# Patient Record
Sex: Male | Born: 1960 | Race: White | Hispanic: No | Marital: Married | Smoking: Never smoker
Health system: Southern US, Community
[De-identification: ages and names within clinical notes are randomized; demographics above are authoritative.]

## PROBLEM LIST (undated history)

## (undated) DIAGNOSIS — F419 Anxiety disorder, unspecified: Secondary | ICD-10-CM

## (undated) DIAGNOSIS — N529 Male erectile dysfunction, unspecified: Secondary | ICD-10-CM

## (undated) DIAGNOSIS — J343 Hypertrophy of nasal turbinates: Secondary | ICD-10-CM

## (undated) DIAGNOSIS — T7840XA Allergy, unspecified, initial encounter: Secondary | ICD-10-CM

## (undated) DIAGNOSIS — E079 Disorder of thyroid, unspecified: Secondary | ICD-10-CM

## (undated) DIAGNOSIS — R202 Paresthesia of skin: Secondary | ICD-10-CM

## (undated) DIAGNOSIS — E039 Hypothyroidism, unspecified: Secondary | ICD-10-CM

## (undated) DIAGNOSIS — G43909 Migraine, unspecified, not intractable, without status migrainosus: Secondary | ICD-10-CM

## (undated) DIAGNOSIS — J349 Unspecified disorder of nose and nasal sinuses: Secondary | ICD-10-CM

## (undated) HISTORY — DX: Male erectile dysfunction, unspecified: N52.9

## (undated) HISTORY — DX: Allergy, unspecified, initial encounter: T78.40XA

## (undated) HISTORY — DX: Disorder of thyroid, unspecified: E07.9

## (undated) HISTORY — DX: Paresthesia of skin: R20.2

## (undated) HISTORY — PX: OTHER SURGICAL HISTORY: SHX169

## (undated) HISTORY — DX: Migraine, unspecified, not intractable, without status migrainosus: G43.909

## (undated) HISTORY — DX: Unspecified disorder of nose and nasal sinuses: J34.9

## (undated) HISTORY — PX: NOSE SURGERY: SHX723

## (undated) HISTORY — PX: HAND SURGERY: SHX662

## (undated) SURGERY — EGD (ESOPHAGOGASTRODUODENOSCOPY)
Anesthesia: Monitor Anesthesia Care

---

## 1999-12-25 ENCOUNTER — Emergency Department (HOSPITAL_COMMUNITY): Admission: EM | Admit: 1999-12-25 | Discharge: 1999-12-25 | Payer: Self-pay

## 2003-08-27 ENCOUNTER — Encounter: Admission: RE | Admit: 2003-08-27 | Discharge: 2003-08-27 | Payer: Self-pay | Admitting: Allergy and Immunology

## 2012-09-12 ENCOUNTER — Encounter: Payer: Self-pay | Admitting: Family Medicine

## 2012-09-12 ENCOUNTER — Ambulatory Visit (INDEPENDENT_AMBULATORY_CARE_PROVIDER_SITE_OTHER): Payer: Managed Care, Other (non HMO) | Admitting: Family Medicine

## 2012-09-12 VITALS — BP 124/78 | Temp 99.5°F | Ht 72.0 in | Wt 200.0 lb

## 2012-09-12 DIAGNOSIS — G4700 Insomnia, unspecified: Secondary | ICD-10-CM

## 2012-09-12 DIAGNOSIS — J309 Allergic rhinitis, unspecified: Secondary | ICD-10-CM

## 2012-09-12 DIAGNOSIS — Z23 Encounter for immunization: Secondary | ICD-10-CM

## 2012-09-12 DIAGNOSIS — E039 Hypothyroidism, unspecified: Secondary | ICD-10-CM

## 2012-09-12 MED ORDER — DIAZEPAM 5 MG PO TABS: 5.0000 mg | ORAL_TABLET | Freq: Four times a day (QID) | ORAL | Status: DC | PRN

## 2012-09-12 MED ORDER — CEFPROZIL 500 MG PO TABS: 500.0000 mg | ORAL_TABLET | Freq: Two times a day (BID) | ORAL | Status: DC

## 2012-09-12 MED ORDER — METHYLPREDNISOLONE ACETATE 80 MG/ML IJ SUSP
80.0000 mg | Freq: Once | INTRAMUSCULAR | Status: AC
  Administered 2012-09-12: 80 mg via INTRAMUSCULAR

## 2012-09-12 NOTE — Progress Notes (Signed)
  Subjective:    Patient ID: Brad Rosales, male    DOB: 1961-01-30, 52 y.o.   MRN: [id number]  Cough This is a recurrent problem. The current episode started more than 1 month ago. The problem has been resolved. The problem occurs every few minutes. The cough is non-productive. Associated symptoms include nasal congestion and rhinorrhea. The symptoms are aggravated by pollens. He has tried nothing for the symptoms.      Review of Systems  HENT: Positive for rhinorrhea.   Respiratory: Positive for cough.   All other systems reviewed and are negative.       Objective:   Physical Exam  Vitals reviewed. Constitutional: He appears well-developed and well-nourished.  HENT:  Head: Normocephalic and atraumatic.  Frontal and nasal congestion evident.  Eyes: Conjunctivae are normal. Pupils are equal, round, and reactive to light.  Neck: Normal range of motion.  Cardiovascular: Normal rate and regular rhythm.   Pulmonary/Chest: Effort normal.          Assessment & Plan:  Allergic rhinitis with probable elements of the bacterial infection and sinusitis and bronchitis. #2 insomnia/anxiety discussed. Plan as per orders

## 2012-09-12 NOTE — Patient Instructions (Signed)
Take all meds as directed

## 2012-09-14 ENCOUNTER — Encounter: Payer: Self-pay | Admitting: Family Medicine

## 2012-09-14 DIAGNOSIS — E039 Hypothyroidism, unspecified: Secondary | ICD-10-CM | POA: Insufficient documentation

## 2013-01-02 ENCOUNTER — Ambulatory Visit: Payer: Self-pay | Admitting: Physician Assistant

## 2013-01-02 VITALS — BP 100/70 | HR 68 | Temp 97.9°F | Resp 18 | Ht 71.5 in | Wt 193.0 lb

## 2013-01-02 DIAGNOSIS — Z0289 Encounter for other administrative examinations: Secondary | ICD-10-CM

## 2013-01-02 NOTE — Progress Notes (Signed)
   [address] [place] [address]   Phone [phone]  Subjective:    Patient ID: Brad Rosales, male    DOB: [birth year], 52 y.o.   MRN: [id number]  HPI Pt presents to clinic for DOT.  He has had them in the past and he always gets a 2 year card.  He is on thyroid medications.  He uses Valium rarely for stress.    Review of Systems  Constitutional: Negative.   HENT: Negative.   Eyes: Negative.   Respiratory: Negative.   Cardiovascular: Negative.   Gastrointestinal: Negative.   Endocrine: Negative.   Genitourinary: Negative.   Musculoskeletal: Negative.   Skin: Negative.   Allergic/Immunologic: Negative.   Neurological: Negative.   Hematological: Negative.   Psychiatric/Behavioral: Negative.        Objective:   Physical Exam  Vitals reviewed. Constitutional: He is oriented to person, place, and time. He appears well-developed and well-nourished.  HENT:  Head: Normocephalic and atraumatic.  Right Ear: Hearing, tympanic membrane, external ear and ear canal normal.  Left Ear: Hearing, tympanic membrane, external ear and ear canal normal.  Nose: Nose normal.  Mouth/Throat: Uvula is midline, oropharynx is clear and moist and mucous membranes are normal.  Eyes: Conjunctivae and EOM are normal. Pupils are equal, round, and reactive to light.  Neck: Normal range of motion. Neck supple.  Cardiovascular: Normal rate, regular rhythm and normal heart sounds.   No murmur heard. Pulmonary/Chest: Effort normal and breath sounds normal.  Abdominal: Soft. Bowel sounds are normal. Hernia confirmed negative in the right inguinal area and confirmed negative in the left inguinal area.  Genitourinary: Penis normal.  Musculoskeletal: Normal range of motion.       Cervical back: Normal.       Lumbar back: Normal.  Lymphadenopathy:    He has no cervical adenopathy.  Neurological: He is alert and oriented to person, place, and time. He has normal reflexes.  Skin: Skin is warm and dry.    Psychiatric: He has a normal mood and affect. His behavior is normal. Judgment and thought content normal.          Assessment & Plan:  DOT exam - 2 year card - scanned  Benny Lennert PA-C 01/02/2013 3:17 PM

## 2013-01-08 ENCOUNTER — Encounter: Payer: Self-pay | Admitting: Physician Assistant

## 2013-03-26 ENCOUNTER — Other Ambulatory Visit: Payer: Self-pay | Admitting: Family Medicine

## 2013-03-27 NOTE — Telephone Encounter (Signed)
Ok with two montly ref

## 2013-09-15 ENCOUNTER — Encounter: Payer: Self-pay | Admitting: Family Medicine

## 2013-09-15 ENCOUNTER — Ambulatory Visit (INDEPENDENT_AMBULATORY_CARE_PROVIDER_SITE_OTHER): Payer: Managed Care, Other (non HMO) | Admitting: Family Medicine

## 2013-09-15 VITALS — BP 122/88 | Ht 72.0 in | Wt 202.0 lb

## 2013-09-15 DIAGNOSIS — Z9109 Other allergy status, other than to drugs and biological substances: Secondary | ICD-10-CM

## 2013-09-15 DIAGNOSIS — J309 Allergic rhinitis, unspecified: Secondary | ICD-10-CM

## 2013-09-15 MED ORDER — CEFPROZIL 500 MG PO TABS: 500.0000 mg | ORAL_TABLET | Freq: Two times a day (BID) | ORAL | Status: DC

## 2013-09-15 MED ORDER — METHYLPREDNISOLONE ACETATE 40 MG/ML IJ SUSP
80.0000 mg | Freq: Once | INTRAMUSCULAR | Status: AC
  Administered 2013-09-15: 80 mg via INTRAMUSCULAR

## 2013-09-15 NOTE — Progress Notes (Signed)
   Subjective:    Patient ID: Brad Rosales, male    DOB: 11/18/1960, 53 y.o.   MRN: [id number]  Cough This is a new problem. The current episode started yesterday. Associated symptoms include nasal congestion, rhinorrhea and a sore throat. Associated symptoms comments: sneezing. The symptoms are aggravated by pollens. He has tried OTC cough suppressant for the symptoms. The treatment provided mild relief.   Non prod cough...   Patient states allergies acting up. Frontal headache. Nasal discharge. No epistaxis.  Review of Systems  HENT: Positive for rhinorrhea and sore throat.   Respiratory: Positive for cough.    No vomiting no diarrhea no rash ROS otherwise negative    Objective:   Physical Exam Alert hydration good. HET moderate his congestion. Frontal tenderness. Neck supple. Pharynx normal lungs clear. Heart regular in rhythm.       Assessment & Plan:  Impression rhinosinusitis with allergic rhinitis plan steroid injection per patient request. Antibiotics prescribed. Symptomatic care discussed. Patient strongly declines any type of screening tests and/or exam or intervention WSL

## 2013-10-02 ENCOUNTER — Encounter: Payer: Self-pay | Admitting: *Deleted

## 2013-11-27 ENCOUNTER — Ambulatory Visit: Payer: Managed Care, Other (non HMO) | Admitting: Family Medicine

## 2013-12-13 ENCOUNTER — Other Ambulatory Visit: Payer: Self-pay | Admitting: Family Medicine

## 2013-12-14 NOTE — Telephone Encounter (Signed)
Ok plus two ref 

## 2014-08-03 ENCOUNTER — Other Ambulatory Visit: Payer: Self-pay | Admitting: Family Medicine

## 2014-08-03 NOTE — Telephone Encounter (Signed)
Last seen 09/15/13

## 2014-08-03 NOTE — Telephone Encounter (Signed)
One mo worth plus one mo ref will need ov befofe further

## 2014-12-01 ENCOUNTER — Ambulatory Visit (INDEPENDENT_AMBULATORY_CARE_PROVIDER_SITE_OTHER): Payer: Self-pay | Admitting: Physician Assistant

## 2014-12-01 VITALS — BP 126/80 | HR 55 | Temp 98.5°F | Resp 16 | Ht 71.0 in | Wt 196.0 lb

## 2014-12-01 DIAGNOSIS — Z021 Encounter for pre-employment examination: Secondary | ICD-10-CM

## 2014-12-01 DIAGNOSIS — Z0289 Encounter for other administrative examinations: Secondary | ICD-10-CM

## 2014-12-01 NOTE — Progress Notes (Signed)
Airline pilotCommercial Driver Medical Examination   Brad Rosales is a 54 y.o. male who presents today for a commercial driver fitness determination physical exam. The patient reports no problems. The following portions of the patient's history were reviewed and updated as appropriate: allergies, current medications, past family history, past medical history, past social history, past surgical history and problem list.  Pt has a history of hypothyroidism. Takes synthroid daily.  Has lumbar DDD and a herniated disc in his cervical spine. He takes diazepam prn at night only.   He is not excessively fatigued during the day and does not snore at night.  PCP: Dr. Lubertha SouthSteve Rosales - sees him annually.  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  Review of Systems Pertinent items are noted in HPI.   Objective:    Vision/hearing:  Visual Acuity Screening   Right eye Left eye Both eyes  Without correction: 20/20 20/40 20/20   With correction:     Comments: Peripheral Vision: Right eye 85 degrees. Left eye 85 degrees. The patient can distinguish the colors red, amber and green. The patient was able to hear a forced whisper from L=10,R=10 feet.   Applicant can recognize and distinguish among traffic control signals and devices showing standard red, green, and amber colors.  Corrective lenses required: No  Monocular Vision?: No  Hearing aid requirement: No  Physical Exam  Constitutional: He is oriented to person, place, and time. He appears well-developed and well-nourished.  HENT:  Head: Normocephalic and atraumatic.  Right Ear: Hearing, tympanic membrane, external ear and ear canal normal.  Left Ear: Hearing, tympanic membrane, external ear and ear canal normal.  Nose: Nose normal.  Mouth/Throat: Uvula is midline, oropharynx is clear and moist and mucous membranes are normal.  Eyes: Conjunctivae, EOM and lids are normal. Pupils are equal, round, and  reactive to light. Right eye exhibits no discharge. Left eye exhibits no discharge. No scleral icterus.  Neck: Trachea normal and normal range of motion. Neck supple.  Cardiovascular: Normal rate, regular rhythm, normal heart sounds, intact distal pulses and normal pulses.   No murmur heard. Pulmonary/Chest: Effort normal and breath sounds normal. No respiratory distress. He has no wheezes. He has no rhonchi. He has no rales.  Abdominal: Soft. Normal appearance and bowel sounds are normal. He exhibits no abdominal bruit. There is no tenderness. No hernia.  Musculoskeletal: Normal range of motion. He exhibits no edema or tenderness.  Lymphadenopathy:       Head (right side): No submental, no submandibular, no tonsillar, no preauricular, no posterior auricular and no occipital adenopathy present.       Head (left side): No submental, no submandibular, no tonsillar, no preauricular, no posterior auricular and no occipital adenopathy present.    He has no cervical adenopathy.  Neurological: He is alert and oriented to person, place, and time. He has normal strength and normal reflexes. No cranial nerve deficit or sensory deficit. Coordination and gait normal.  Skin: Skin is warm, dry and intact. No lesion and no rash noted.  Psychiatric: He has a normal mood and affect. His speech is normal and behavior is normal. Judgment and thought content normal.   BP 126/80 mmHg  Pulse 55  Temp(Src) 98.5 F (36.9 C) (Oral)  Resp 16  Ht 5\' 11"  (1.803 m)  Wt 196 lb (88.905 kg)  BMI 27.35 kg/m2  SpO2 97%  Labs: SP GR  1.010 GLU neg PRO neg BLOOD  neg  Assessment:    Healthy male exam.  Meets standards in 38 CFR 391.41;  qualifies for 2 year certificate.    Plan:    Medical examiners certificate completed and printed. Return as needed.  Follow up with PCP as needed.

## 2014-12-09 ENCOUNTER — Telehealth: Payer: Self-pay | Admitting: Nurse Practitioner

## 2014-12-09 ENCOUNTER — Other Ambulatory Visit: Payer: Self-pay

## 2014-12-09 MED ORDER — ALPRAZOLAM 1 MG PO TABS: 1.0000 mg | ORAL_TABLET | Freq: Every evening | ORAL | Status: DC | PRN

## 2014-12-09 NOTE — Telephone Encounter (Signed)
[facility].. Faxed script to pharmacy.

## 2014-12-09 NOTE — Telephone Encounter (Signed)
Xanax 1.0 mg numb thirty one qhs prn sleep spass

## 2014-12-09 NOTE — Telephone Encounter (Signed)
Patient says that the diazepam is not helping him sleep at night although it is helping his muscle spasms in his back.  He wants to know if the diazepam can be changed to xanax.  He says preferably 10 mg.   [facility] [place]

## 2015-01-13 ENCOUNTER — Other Ambulatory Visit: Payer: Self-pay | Admitting: Family Medicine

## 2015-01-14 NOTE — Telephone Encounter (Signed)
May have this plus one additional refill 

## 2015-03-28 ENCOUNTER — Other Ambulatory Visit: Payer: Self-pay | Admitting: Family Medicine

## 2015-03-29 NOTE — Telephone Encounter (Signed)
Sorry not seen for yr and a half

## 2015-04-06 ENCOUNTER — Other Ambulatory Visit: Payer: Self-pay | Admitting: Family Medicine

## 2015-04-06 NOTE — Telephone Encounter (Signed)
15 days only, not seen for yr and a half needs appt

## 2015-05-01 ENCOUNTER — Other Ambulatory Visit: Payer: Self-pay | Admitting: Family Medicine

## 2015-05-02 NOTE — Telephone Encounter (Signed)
Sorry not seen 1.5 years ntbs first

## 2015-05-03 ENCOUNTER — Telehealth: Payer: Self-pay | Admitting: Family Medicine

## 2015-05-03 MED ORDER — ALPRAZOLAM 1 MG PO TABS: ORAL_TABLET | ORAL | Status: DC

## 2015-05-03 NOTE — Telephone Encounter (Signed)
Refill on what medicine?

## 2015-05-03 NOTE — Telephone Encounter (Signed)
Notified patient that script will be faxed to pharmacy today.  

## 2015-05-03 NOTE — Telephone Encounter (Signed)
Just recently i said no since one and a half yr, but since mixup occurred somewhow will do one rx

## 2015-05-03 NOTE — Telephone Encounter (Signed)
Pt came in today thinking his appt was today when it was tomorrow. Pt is unable to get off tomorrow and rescheduled for the 23 pt is wanting to know if he can get a refill to last him to that appt.   [facility]

## 2015-05-03 NOTE — Telephone Encounter (Signed)
Generic xanax

## 2015-05-04 ENCOUNTER — Ambulatory Visit (INDEPENDENT_AMBULATORY_CARE_PROVIDER_SITE_OTHER): Payer: 59 | Admitting: Family Medicine

## 2015-05-04 ENCOUNTER — Encounter: Payer: Self-pay | Admitting: Family Medicine

## 2015-05-04 ENCOUNTER — Ambulatory Visit: Payer: Managed Care, Other (non HMO) | Admitting: Family Medicine

## 2015-05-04 VITALS — BP 124/80 | Ht 72.0 in | Wt 195.0 lb

## 2015-05-04 DIAGNOSIS — E038 Other specified hypothyroidism: Secondary | ICD-10-CM

## 2015-05-04 DIAGNOSIS — G4700 Insomnia, unspecified: Secondary | ICD-10-CM | POA: Diagnosis not present

## 2015-05-04 DIAGNOSIS — E039 Hypothyroidism, unspecified: Secondary | ICD-10-CM | POA: Diagnosis not present

## 2015-05-04 DIAGNOSIS — M549 Dorsalgia, unspecified: Secondary | ICD-10-CM

## 2015-05-04 DIAGNOSIS — G8929 Other chronic pain: Secondary | ICD-10-CM

## 2015-05-04 MED ORDER — ALPRAZOLAM 1 MG PO TABS: ORAL_TABLET | ORAL | Status: DC

## 2015-05-04 MED ORDER — DIAZEPAM 5 MG PO TABS: ORAL_TABLET | ORAL | Status: DC

## 2015-05-04 NOTE — Progress Notes (Signed)
   Subjective:    Patient ID: Brad Rosales, male    DOB: 04/14/1961, 54 y.o.   MRN: [id number]  HPIpt arrives today for a med check. Needs refill on xanax. Takes at xanax for sleep at night and takes valium as needed for muscle spasms.   Sees dr Lisabeth Devoidballan for hypothyroid. Has not seen in about 2 years. Would like to get refill on levothyroxine. Thyr not cked for past couple years. No symptoms of high or low thyroid.    Cramping pain in groin area. Started over 1 year ago. MRI of right lower groin showed no evid of hernia', chronic challenge with this since work injury a couple years ago. Intermittent flares  Declines flu vaccine.    Valium needs two or three time per wk, uses prn, takes aleave at times and or goody powders. States this deftly helps his back. In needs her medication. No obvious side effects from it. Next  Uses Xanax primarily to help sleep. States also definitely needs adamant that it benefits.    Review of Systems No headache no chest pain no back pain no abdominal pain no change in bowel habits ROS otherwise negative    Objective:   Physical Exam  Alert vital stable no acute distress HEENT normal thyroid nonpalpable lungs clear heart regular in rhythm. Low back some tenderness to deep palpation negative straight leg raise      Assessment & Plan:  Impression 1 insomnia with element of anxiety discussed with medicines reviewed and ongoing need for medicine discussed #2 back spasms intermittent with thallium helping discussed #3 hypothyroidism status uncertain patient would like us to start taking over plan back exercises recommended. Indications refilled. Diet exercise discussed. Blood work further recommendations based results WSL

## 2015-05-05 LAB — T4: T4, Total: 8.1 ug/dL (ref 4.5–12.0)

## 2015-05-05 LAB — TSH: TSH: 2.74 u[IU]/mL (ref 0.450–4.500)

## 2015-05-08 DIAGNOSIS — G8929 Other chronic pain: Secondary | ICD-10-CM | POA: Insufficient documentation

## 2015-05-08 DIAGNOSIS — M549 Dorsalgia, unspecified: Secondary | ICD-10-CM

## 2015-05-09 ENCOUNTER — Encounter: Payer: Self-pay | Admitting: Family Medicine

## 2015-05-09 ENCOUNTER — Telehealth: Payer: Self-pay | Admitting: *Deleted

## 2015-05-09 NOTE — Telephone Encounter (Signed)
[facility] [place] requesting levothyroxin 75 mcg one daily. Originally prescribed by dr Talmage Napbalan. Pt last seen for check up on 05/04/15

## 2015-05-09 NOTE — Telephone Encounter (Signed)
Ok 12 mo worth 

## 2015-05-10 MED ORDER — LEVOTHYROXINE SODIUM 75 MCG PO TABS: 75.0000 ug | ORAL_TABLET | Freq: Every day | ORAL | Status: DC

## 2015-05-10 NOTE — Telephone Encounter (Signed)
Medication sent in electronically per Dr.Steve Luking

## 2015-05-18 ENCOUNTER — Ambulatory Visit: Payer: Self-pay | Admitting: Family Medicine

## 2015-05-23 ENCOUNTER — Ambulatory Visit: Payer: Managed Care, Other (non HMO) | Admitting: Nurse Practitioner

## 2015-05-30 ENCOUNTER — Encounter: Payer: Self-pay | Admitting: Family Medicine

## 2015-05-30 ENCOUNTER — Ambulatory Visit (INDEPENDENT_AMBULATORY_CARE_PROVIDER_SITE_OTHER): Payer: 59 | Admitting: Family Medicine

## 2015-05-30 VITALS — Temp 98.1°F | Ht 72.0 in | Wt 201.0 lb

## 2015-05-30 DIAGNOSIS — J019 Acute sinusitis, unspecified: Secondary | ICD-10-CM | POA: Diagnosis not present

## 2015-05-30 DIAGNOSIS — B9689 Other specified bacterial agents as the cause of diseases classified elsewhere: Secondary | ICD-10-CM

## 2015-05-30 MED ORDER — AZITHROMYCIN 250 MG PO TABS: ORAL_TABLET | ORAL | Status: DC

## 2015-05-30 NOTE — Progress Notes (Signed)
   Subjective:    Patient ID: Brad Rosales, male    DOB: 04/07/1961, 54 y.o.   MRN: [id number]  Cough This is a new problem. The current episode started in the past 7 days. Associated symptoms include ear pain, nasal congestion, rhinorrhea and a sore throat. Pertinent negatives include no chest pain, fever or wheezing. Nothing aggravates the symptoms.   Patient when no fever having a lot of head congestion drainage coughing denies any wheezing. Energy level subpar moderate sinus pressure with some pressure into the years   Review of Systems  Constitutional: Negative for fever and activity change.  HENT: Positive for congestion, ear pain, rhinorrhea and sore throat.   Eyes: Negative for discharge.  Respiratory: Positive for cough. Negative for wheezing.   Cardiovascular: Negative for chest pain.       Objective:   Physical Exam  Constitutional: He appears well-developed.  HENT:  Head: Normocephalic.  Mouth/Throat: Oropharynx is clear and moist. No oropharyngeal exudate.  Neck: Normal range of motion.  Cardiovascular: Normal rate, regular rhythm and normal heart sounds.   No murmur heard. Pulmonary/Chest: Effort normal and breath sounds normal. He has no wheezes.  Lymphadenopathy:    He has no cervical adenopathy.  Neurological: He exhibits normal muscle tone.  Skin: Skin is warm and dry.  Nursing note and vitals reviewed.         Assessment & Plan:  Viral syndrome Secondary rhinosinusitis Antibiotics prescribed warning signs discussed follow-up if problems

## 2015-09-20 ENCOUNTER — Ambulatory Visit (INDEPENDENT_AMBULATORY_CARE_PROVIDER_SITE_OTHER): Payer: Self-pay | Admitting: Family Medicine

## 2015-09-20 ENCOUNTER — Encounter: Payer: Self-pay | Admitting: Family Medicine

## 2015-09-20 VITALS — BP 102/72 | Ht 72.0 in

## 2015-09-20 DIAGNOSIS — B9689 Other specified bacterial agents as the cause of diseases classified elsewhere: Secondary | ICD-10-CM

## 2015-09-20 DIAGNOSIS — H6502 Acute serous otitis media, left ear: Secondary | ICD-10-CM

## 2015-09-20 DIAGNOSIS — J019 Acute sinusitis, unspecified: Secondary | ICD-10-CM

## 2015-09-20 MED ORDER — AMOXICILLIN-POT CLAVULANATE 875-125 MG PO TABS: 1.0000 | ORAL_TABLET | Freq: Two times a day (BID) | ORAL | Status: DC

## 2015-09-20 MED ORDER — AMOXICILLIN-POT CLAVULANATE 875-125 MG PO TABS: 1.0000 | ORAL_TABLET | Freq: Two times a day (BID) | ORAL | Status: AC

## 2015-09-20 NOTE — Progress Notes (Signed)
   Subjective:    Patient ID: Brad Rosales, male    DOB: 11/20/1960, [age] y.o.   MRN: [id number]  Cough This is a new problem. The current episode started 1 to 4 weeks ago. The cough is productive of sputum. Associated symptoms include ear congestion and rhinorrhea.   Patient also has c/o of elbow pain from injury.Onset of symptoms 3 years ago.at times when he bumps it particularly in an ulnar region has a sudden discomfort. Minimal radiation  Left ear discomfort progressive past several days no fever or chills some frontal headache Review of Systems  HENT: Positive for rhinorrhea.   Respiratory: Positive for cough.        Objective:   Physical Exam  Alert mild malaise H&T moderate nasal congestion frontal tenderness left otitis media with definite bulging effusion and cloudy pharynx normal lungs clear heart rhythm right elbow good range of motion no point tenderness currently      Assessment & Plan:  Impression 1 rhinosinusitis with left otitis media #2 elbow pain with residual neuropraxia type discomfort years after injury only occurs with direct contusion no weakness no radiation plan patient wishes to hold off on further elbow workup for now. Antibiotics prescribed. Symptom care discussed

## 2015-11-15 ENCOUNTER — Other Ambulatory Visit: Payer: Self-pay | Admitting: Family Medicine

## 2015-11-15 NOTE — Telephone Encounter (Signed)
Ok six mo 

## 2015-11-24 ENCOUNTER — Ambulatory Visit (INDEPENDENT_AMBULATORY_CARE_PROVIDER_SITE_OTHER): Payer: BLUE CROSS/BLUE SHIELD | Admitting: Neurology

## 2015-11-24 ENCOUNTER — Encounter: Payer: Self-pay | Admitting: Neurology

## 2015-11-24 VITALS — BP 118/76 | HR 62 | Ht 72.0 in | Wt 193.0 lb

## 2015-11-24 DIAGNOSIS — R202 Paresthesia of skin: Secondary | ICD-10-CM | POA: Insufficient documentation

## 2015-11-24 HISTORY — DX: Paresthesia of skin: R20.2

## 2015-11-24 NOTE — Progress Notes (Signed)
Reason for visit: Numbness  Referring physician: Dr. Bobbye Charleston Brad Rosales is a 55 y.o. male  History of present illness:  Brad Rosales is a 55 year old right-handed white male with numbness that dates back at least one year. The patient has had some occasional shooting pains in the feet, he has numbness that is persistent in the index finger of the left hand. He has chronic low back pain and neck discomfort. He indicates that his low back has been bothersome to him since he was 55 years old. The patient indicates that his feet bother him particularly when he is driving a car. The feet are persistently numb. The patient may have some issues at nighttime, but this does not usually keep him awake at night. He denies any true weakness of extremities, he denies any significant balance issues or difficulty controlling the bowels or the bladder. He has undergone MRI evaluations through [facility]. MRI of the cervical spine does show a C5-6 disc osteophyte complex off to the left compressing the C6 nerve root. Neuroforaminal stenosis is seen bilaterally at the C6-7 level, possibly impinging the C7 nerve roots bilaterally. MRI of the lumbar spine shows a small left lateral disc which may compress the left S1 nerve root. The patient has had an epidural steroid injection without significant benefit according to the patient. He is sent to this office for further evaluation of the foot numbness.  Past Medical History  Diagnosis Date  . Thyroid disease     hyprothyroid  . Allergy     seasonal  . ED (erectile dysfunction)   . Migraines   . Sinus trouble   . Paresthesia 11/24/2015    Past Surgical History  Procedure Laterality Date  . Nose surgery    . Hand surgery Bilateral     Right thumb and Left thumb    Family History  Problem Relation Age of Onset  . Hypertension Father   . Congestive Heart Failure Father   . Dementia Mother     Social history:  reports that he has never smoked.  He does not have any smokeless tobacco history on file. He reports that he does not drink alcohol or use illicit drugs.  Medications:  Prior to Admission medications   Medication Sig Start Date End Date Taking? Authorizing Provider  ALPRAZolam Prudy Feeler) 1 MG tablet TAKE ONE TABLET BY MOUTH AT BEDTIME AS NEEDED FOR  ANXIETY 11/15/15  Yes Merlyn Albert, MD  diazepam (VALIUM) 5 MG tablet TAKE ONE TABLET BY MOUTH AT BEDTIME AS NEEDED FOR MUSCLE SPASM OR ANXIETY 05/04/15  Yes Merlyn Albert, MD  levothyroxine (SYNTHROID, LEVOTHROID) 75 MCG tablet Take 1 tablet (75 mcg total) by mouth daily. 05/10/15  Yes Merlyn Albert, MD      Allergies  Allergen Reactions  . Ciprofloxacin     Headache and turns eyes yellow    ROS:  Out of a complete 14 system review of symptoms, the patient complains only of the following symptoms, and all other reviewed systems are negative.  Fevers, chills, fatigue Ringing in the ears, difficulty swallowing Birthmarks Blurred vision Snoring Easy bruising, easy bleeding Feeling hot, cold Joint pain Numbness, difficulty swallowing Change in appetite Insomnia  Blood pressure 118/76, pulse 62, height 6' (1.829 m), weight 193 lb (87.544 kg).  Physical Exam  General: The patient is alert and cooperative at the time of the examination.  Eyes: Pupils are equal, round, and reactive to light. Discs are flat bilaterally.  Neck: The neck is supple, no carotid bruits are noted.  Respiratory: The respiratory examination is clear.  Cardiovascular: The cardiovascular examination reveals a regular rate and rhythm, no obvious murmurs or rubs are noted.  Neuromuscular: Range of movement of the cervical spine is relatively full. Patient lacks about 20 of full flexion of the low back.  Skin: Extremities are without significant edema.  Neurologic Exam  Mental status: The patient is alert and oriented x 3 at the time of the examination. The patient has apparent  normal recent and remote memory, with an apparently normal attention span and concentration ability.  Cranial nerves: Facial symmetry is present. There is good sensation of the face to pinprick and soft touch bilaterally. The strength of the facial muscles and the muscles to head turning and shoulder shrug are normal bilaterally. Speech is well enunciated, no aphasia or dysarthria is noted. Extraocular movements are full. Visual fields are full. The tongue is midline, and the patient has symmetric elevation of the soft palate. No obvious hearing deficits are noted.  Motor: The motor testing reveals 5 over 5 strength of all 4 extremities. Good symmetric motor tone is noted throughout.  Sensory: Sensory testing is intact to pinprick, soft touch, vibration sensation, and position sense on all 4 extremities. No definite stocking pattern pinprick sensory deficit is noted. No evidence of extinction is noted.  Coordination: Cerebellar testing reveals good finger-nose-finger and heel-to-shin bilaterally.  Gait and station: Gait is normal. Tandem gait is normal. Romberg is negative. No drift is seen. The patient is able to walk on heels and the toes.  Reflexes: Deep tendon reflexes are symmetric and normal bilaterally, with exception of his depression of the left ankle jerk reflex. Toes are downgoing bilaterally.   Assessment/Plan:  1. Bilateral foot numbness  2. Left index finger numbness, possibly related to a C6 nerve root compression  3. Possible low-grade left S1 nerve root compression  The patient has numbness in both feet, no definite evidence of a peripheral neuropathy is seen. The patient will be set up for some blood work today, he will have nerve conduction studies done on both legs and the left arm. EMG will be done on the left leg. If the sensory alterations in the feet are significant, medication can be added in the future for the discomfort. He will follow-up for EMG evaluation.  Marlan Palau.  Keith Willis MD 11/24/2015 6:12 PM  [facility] [address] [place], [place] [id number]  Phone [phone] Fax [phone]

## 2015-11-25 LAB — ANA W/REFLEX: Anti Nuclear Antibody(ANA): NEGATIVE

## 2015-11-25 LAB — RPR: RPR Ser Ql: NONREACTIVE

## 2015-11-25 LAB — ANGIOTENSIN CONVERTING ENZYME: Angio Convert Enzyme: 34 U/L (ref 14–82)

## 2015-11-25 LAB — B. BURGDORFI ANTIBODIES: Lyme IgG/IgM Ab: <0.91 {ISR} (ref 0.00–0.90)

## 2015-11-25 LAB — SEDIMENTATION RATE: Sed Rate: 2 mm/hr (ref 0–30)

## 2015-11-25 LAB — HIV ANTIBODY (ROUTINE TESTING W REFLEX): HIV Screen 4th Generation wRfx: NONREACTIVE

## 2015-11-25 LAB — VITAMIN B12: Vitamin B-12: 780 pg/mL (ref 211–946)

## 2015-11-28 ENCOUNTER — Telehealth: Payer: Self-pay

## 2015-11-28 NOTE — Telephone Encounter (Signed)
Called pt w/ unremarkable lab results. Verbalized understanding and appreciation for call. Mailed appt reminder for MEG/NCV scheduled in July per pt request.

## 2015-11-28 NOTE — Telephone Encounter (Signed)
-----   Message from York Spanielharles K Willis, MD sent at 11/28/2015  7:14 AM EDT -----  The blood work results are unremarkable. Please call the patient.  ----- Message -----    From: [facility] Lab Results In Interface    Sent: 11/25/2015   7:44 AM      To: York Spanielharles K Willis, MD

## 2015-12-02 ENCOUNTER — Other Ambulatory Visit: Payer: Self-pay | Admitting: Family Medicine

## 2015-12-02 DIAGNOSIS — M5441 Lumbago with sciatica, right side: Secondary | ICD-10-CM | POA: Diagnosis not present

## 2015-12-02 DIAGNOSIS — G8929 Other chronic pain: Secondary | ICD-10-CM | POA: Diagnosis not present

## 2015-12-02 DIAGNOSIS — M50322 Other cervical disc degeneration at C5-C6 level: Secondary | ICD-10-CM | POA: Diagnosis not present

## 2015-12-02 DIAGNOSIS — M5442 Lumbago with sciatica, left side: Secondary | ICD-10-CM | POA: Diagnosis not present

## 2015-12-05 DIAGNOSIS — M5441 Lumbago with sciatica, right side: Secondary | ICD-10-CM | POA: Diagnosis not present

## 2015-12-05 DIAGNOSIS — M5442 Lumbago with sciatica, left side: Secondary | ICD-10-CM | POA: Diagnosis not present

## 2015-12-05 DIAGNOSIS — M50322 Other cervical disc degeneration at C5-C6 level: Secondary | ICD-10-CM | POA: Diagnosis not present

## 2015-12-05 DIAGNOSIS — G8929 Other chronic pain: Secondary | ICD-10-CM | POA: Diagnosis not present

## 2015-12-05 NOTE — Telephone Encounter (Signed)
Ok plus two ref 

## 2015-12-09 DIAGNOSIS — M5441 Lumbago with sciatica, right side: Secondary | ICD-10-CM | POA: Diagnosis not present

## 2015-12-09 DIAGNOSIS — M5442 Lumbago with sciatica, left side: Secondary | ICD-10-CM | POA: Diagnosis not present

## 2015-12-09 DIAGNOSIS — M50322 Other cervical disc degeneration at C5-C6 level: Secondary | ICD-10-CM | POA: Diagnosis not present

## 2015-12-09 DIAGNOSIS — G8929 Other chronic pain: Secondary | ICD-10-CM | POA: Diagnosis not present

## 2015-12-12 DIAGNOSIS — M5441 Lumbago with sciatica, right side: Secondary | ICD-10-CM | POA: Diagnosis not present

## 2015-12-12 DIAGNOSIS — M50322 Other cervical disc degeneration at C5-C6 level: Secondary | ICD-10-CM | POA: Diagnosis not present

## 2015-12-12 DIAGNOSIS — M5442 Lumbago with sciatica, left side: Secondary | ICD-10-CM | POA: Diagnosis not present

## 2015-12-12 DIAGNOSIS — G8929 Other chronic pain: Secondary | ICD-10-CM | POA: Diagnosis not present

## 2015-12-16 DIAGNOSIS — G8929 Other chronic pain: Secondary | ICD-10-CM | POA: Diagnosis not present

## 2015-12-16 DIAGNOSIS — M50322 Other cervical disc degeneration at C5-C6 level: Secondary | ICD-10-CM | POA: Diagnosis not present

## 2015-12-16 DIAGNOSIS — M5441 Lumbago with sciatica, right side: Secondary | ICD-10-CM | POA: Diagnosis not present

## 2015-12-16 DIAGNOSIS — M5442 Lumbago with sciatica, left side: Secondary | ICD-10-CM | POA: Diagnosis not present

## 2015-12-19 DIAGNOSIS — M5442 Lumbago with sciatica, left side: Secondary | ICD-10-CM | POA: Diagnosis not present

## 2015-12-19 DIAGNOSIS — G8929 Other chronic pain: Secondary | ICD-10-CM | POA: Diagnosis not present

## 2015-12-19 DIAGNOSIS — M50322 Other cervical disc degeneration at C5-C6 level: Secondary | ICD-10-CM | POA: Diagnosis not present

## 2015-12-19 DIAGNOSIS — M5441 Lumbago with sciatica, right side: Secondary | ICD-10-CM | POA: Diagnosis not present

## 2015-12-26 ENCOUNTER — Ambulatory Visit (INDEPENDENT_AMBULATORY_CARE_PROVIDER_SITE_OTHER): Payer: BLUE CROSS/BLUE SHIELD | Admitting: Neurology

## 2015-12-26 ENCOUNTER — Ambulatory Visit (INDEPENDENT_AMBULATORY_CARE_PROVIDER_SITE_OTHER): Payer: Self-pay | Admitting: Neurology

## 2015-12-26 ENCOUNTER — Encounter: Payer: Self-pay | Admitting: Neurology

## 2015-12-26 DIAGNOSIS — R202 Paresthesia of skin: Secondary | ICD-10-CM | POA: Diagnosis not present

## 2015-12-26 DIAGNOSIS — M5442 Lumbago with sciatica, left side: Secondary | ICD-10-CM | POA: Diagnosis not present

## 2015-12-26 DIAGNOSIS — M5441 Lumbago with sciatica, right side: Secondary | ICD-10-CM | POA: Diagnosis not present

## 2015-12-26 DIAGNOSIS — G8929 Other chronic pain: Secondary | ICD-10-CM | POA: Diagnosis not present

## 2015-12-26 DIAGNOSIS — M50322 Other cervical disc degeneration at C5-C6 level: Secondary | ICD-10-CM | POA: Diagnosis not present

## 2015-12-26 NOTE — Procedures (Signed)
     HISTORY:  Brad Rosales is a 55 year old gentleman with a history of bilateral foot numbness, some low back pain and a history of neck discomfort and some numbness into the left hand. The patient is being evaluated for a possible peripheral neuropathy versus a radiculopathy. The patient has evidence of possible impingement of the left C7 nerve root, and possible impingement of the left S1 nerve root by MRI.  NERVE CONDUCTION STUDIES:  Nerve conduction studies were performed on the left upper extremity. The distal motor latencies and motor amplitudes for the median and ulnar nerves were within normal limits. The F wave latencies and nerve conduction velocities for these nerves were also normal. The sensory latencies for the median and ulnar nerves were normal.  Nerve conduction studies were performed on both lower extremities. The distal motor latencies and motor amplitudes for the peroneal and posterior tibial nerves were within normal limits. The nerve conduction velocities for these nerves were also normal. The H reflex latencies were normal. The sensory latencies for the peroneal nerves were within normal limits.   EMG STUDIES:  EMG study was performed on the left lower extremity:  The tibialis anterior muscle reveals 2 to 4K motor units with full recruitment. No fibrillations or positive waves were seen. The peroneus tertius muscle reveals 2 to 5K motor units with decreased recruitment. No fibrillations or positive waves were seen. The medial gastrocnemius muscle reveals 1 to 3K motor units with slightly decreased recruitment. 1+ positive waves were seen. The vastus lateralis muscle reveals 2 to 4K motor units with full recruitment. No fibrillations or positive waves were seen. The iliopsoas muscle reveals 2 to 4K motor units with full recruitment. No fibrillations or positive waves were seen. The biceps femoris muscle (long head) reveals 2 to 4K motor units with full recruitment. No  fibrillations or positive waves were seen. The lumbosacral paraspinal muscles were tested at 3 levels, and revealed no abnormalities of insertional activity at all 3 levels tested. There was good relaxation.   IMPRESSION:  Nerve conduction studies done on the left upper extremity and both lower extremities were within normal limits. No evidence of a peripheral neuropathy is seen. A small fiber neuropathy cannot be excluded by standard nerve conduction study. Clinical correlation is required. EMG evaluation of the left lower extremity shows findings consistent with a very low-grade acute and chronic S1 radiculopathy.  Marlan Palau. Keith Wynema Garoutte MD 12/26/2015 4:20 PM  [facility] [address] [place], [place] [id number]  Phone [phone] Fax [phone]

## 2015-12-26 NOTE — Progress Notes (Signed)
Please refer to EMG and nerve conduction study procedure note. 

## 2015-12-30 DIAGNOSIS — M50322 Other cervical disc degeneration at C5-C6 level: Secondary | ICD-10-CM | POA: Diagnosis not present

## 2015-12-30 DIAGNOSIS — M5442 Lumbago with sciatica, left side: Secondary | ICD-10-CM | POA: Diagnosis not present

## 2015-12-30 DIAGNOSIS — M5441 Lumbago with sciatica, right side: Secondary | ICD-10-CM | POA: Diagnosis not present

## 2015-12-30 DIAGNOSIS — M5136 Other intervertebral disc degeneration, lumbar region: Secondary | ICD-10-CM | POA: Diagnosis not present

## 2016-01-14 DIAGNOSIS — M50322 Other cervical disc degeneration at C5-C6 level: Secondary | ICD-10-CM | POA: Diagnosis not present

## 2016-01-31 DIAGNOSIS — M5136 Other intervertebral disc degeneration, lumbar region: Secondary | ICD-10-CM | POA: Diagnosis not present

## 2016-02-13 DIAGNOSIS — M5442 Lumbago with sciatica, left side: Secondary | ICD-10-CM | POA: Diagnosis not present

## 2016-02-13 DIAGNOSIS — M50322 Other cervical disc degeneration at C5-C6 level: Secondary | ICD-10-CM | POA: Diagnosis not present

## 2016-05-01 ENCOUNTER — Other Ambulatory Visit: Payer: Self-pay | Admitting: Family Medicine

## 2016-05-01 NOTE — Telephone Encounter (Signed)
One mo only ok needs chronic o v none since lst nov

## 2016-05-08 ENCOUNTER — Encounter: Payer: Self-pay | Admitting: Family Medicine

## 2016-05-08 ENCOUNTER — Ambulatory Visit (INDEPENDENT_AMBULATORY_CARE_PROVIDER_SITE_OTHER): Payer: PRIVATE HEALTH INSURANCE | Admitting: Family Medicine

## 2016-05-08 VITALS — BP 122/78 | Ht 72.0 in | Wt 189.2 lb

## 2016-05-08 DIAGNOSIS — E038 Other specified hypothyroidism: Secondary | ICD-10-CM

## 2016-05-08 DIAGNOSIS — F5101 Primary insomnia: Secondary | ICD-10-CM | POA: Diagnosis not present

## 2016-05-08 DIAGNOSIS — H6502 Acute serous otitis media, left ear: Secondary | ICD-10-CM | POA: Diagnosis not present

## 2016-05-08 MED ORDER — ALPRAZOLAM 1 MG PO TABS: ORAL_TABLET | ORAL | 5 refills | Status: DC

## 2016-05-08 MED ORDER — AMOXICILLIN-POT CLAVULANATE 875-125 MG PO TABS: 1.0000 | ORAL_TABLET | Freq: Two times a day (BID) | ORAL | 0 refills | Status: DC

## 2016-05-08 MED ORDER — LEVOTHYROXINE SODIUM 75 MCG PO TABS: 75.0000 ug | ORAL_TABLET | Freq: Every day | ORAL | 11 refills | Status: DC

## 2016-05-08 MED ORDER — DIAZEPAM 5 MG PO TABS: ORAL_TABLET | ORAL | 0 refills | Status: DC

## 2016-05-08 NOTE — Progress Notes (Signed)
   Subjective:    Patient ID: Brad Rosales, male    DOB: 01/06/1961, 55 y.o.   MRN: [id number] Patient arrives office with numerous concerns HPI  Patient arrives for a follow up on hypothyroidism. States current blood work not covered by [employer] requests not to do it this time. Patient currently on levothyroxine 75mcg. does not miss a dose no symptoms of high or low thyroid.   Patient also reports sinus symptoms for 2 weeks and thinks he needs an antibiotic.two weeks cong and cold. Left ear stopped up. Left ear congestion stuffed up intermittently.  Uses xanax qhs prn sleep, needs it. No obvious side effects in the morning. Uses it pretty much every night. Also notes uses diazepam. When necessary. States deftly helps definitely needs to have it on hand necessary   Heart shaking and breath and wakes up [place] of panic particularly when he does not take Xanax Review of Systems No headache, no major weight loss or weight gain, no chest pain no back pain abdominal pain no change in bowel habits complete ROS otherwise negative     Objective:   Physical Exam Alert vitals stable, NAD. Blood pressure good on repeat. HEENT Left otitis media otherwise normalRequests thyroid nonpalpable. Lungs clear. Heart regular rate and rhythm. Thyroid nonpalpable        Assessment & Plan:  Impression 1 acute subacute rhinosinusitis with left otitis media #2 hypothyroidism discussed with compliant with meds. Unable to get blood work discussed #3 chronic insomnia. With ongoing need for benzodiazepines. No obvious side effects per patient plan diet exercise discussed. Antibiotics prescribed. Thyroid meds refilled. Nocturnal benzodiazepines refilled and side effects benefits discussed

## 2016-06-02 ENCOUNTER — Other Ambulatory Visit: Payer: Self-pay | Admitting: Family Medicine

## 2016-06-04 ENCOUNTER — Telehealth: Payer: Self-pay | Admitting: Family Medicine

## 2016-06-04 MED ORDER — CEFPROZIL 500 MG PO TABS: 500.0000 mg | ORAL_TABLET | Freq: Two times a day (BID) | ORAL | 0 refills | Status: DC

## 2016-06-04 NOTE — Telephone Encounter (Signed)
Tried to call no answer. Voicemail not setup. Med was sent to pharmacy.

## 2016-06-04 NOTE — Telephone Encounter (Signed)
cefzil 500 bid ten d 

## 2016-06-04 NOTE — Telephone Encounter (Signed)
Left ear stopped up. No other symptoms. Patient was given Augmentin 875 mg 1 tab BID #28.

## 2016-06-04 NOTE — Telephone Encounter (Signed)
Six mo worth ok 

## 2016-06-04 NOTE — Telephone Encounter (Signed)
Patient says he is still having the same issue as he was having with his ear on 05/08/16 when he saw Dr. Brett CanalesSteve and was diagnosed with acute serous otitis media of left ear.  He is requesting Dr. Brett CanalesSteve to call in another antibiotic for him.   [facility]

## 2016-06-11 ENCOUNTER — Telehealth: Payer: Self-pay | Admitting: *Deleted

## 2016-06-11 NOTE — Telephone Encounter (Signed)
Wife in office today and states husband just seen recently. (11/14) finished antibiotic. Still coughing and wheezing. Requesting hycodan cough syrup.

## 2016-08-28 ENCOUNTER — Emergency Department (HOSPITAL_COMMUNITY): Payer: No Typology Code available for payment source

## 2016-08-28 ENCOUNTER — Emergency Department (HOSPITAL_COMMUNITY)
Admission: EM | Admit: 2016-08-28 | Discharge: 2016-08-28 | Disposition: A | Discharge status: Home / Self Care | Payer: No Typology Code available for payment source | Attending: Emergency Medicine | Admitting: Emergency Medicine

## 2016-08-28 ENCOUNTER — Encounter (HOSPITAL_COMMUNITY): Payer: Self-pay | Admitting: *Deleted

## 2016-08-28 DIAGNOSIS — Y9389 Activity, other specified: Secondary | ICD-10-CM | POA: Diagnosis not present

## 2016-08-28 DIAGNOSIS — Y999 Unspecified external cause status: Secondary | ICD-10-CM | POA: Diagnosis not present

## 2016-08-28 DIAGNOSIS — Z79899 Other long term (current) drug therapy: Secondary | ICD-10-CM | POA: Diagnosis not present

## 2016-08-28 DIAGNOSIS — Y92410 Unspecified street and highway as the place of occurrence of the external cause: Secondary | ICD-10-CM | POA: Insufficient documentation

## 2016-08-28 DIAGNOSIS — E039 Hypothyroidism, unspecified: Secondary | ICD-10-CM | POA: Insufficient documentation

## 2016-08-28 DIAGNOSIS — S3992XA Unspecified injury of lower back, initial encounter: Secondary | ICD-10-CM | POA: Diagnosis present

## 2016-08-28 DIAGNOSIS — S39012A Strain of muscle, fascia and tendon of lower back, initial encounter: Secondary | ICD-10-CM | POA: Diagnosis not present

## 2016-08-28 DIAGNOSIS — R51 Headache: Secondary | ICD-10-CM | POA: Insufficient documentation

## 2016-08-28 DIAGNOSIS — S161XXA Strain of muscle, fascia and tendon at neck level, initial encounter: Secondary | ICD-10-CM | POA: Diagnosis not present

## 2016-08-28 MED ORDER — OXYCODONE-ACETAMINOPHEN 5-325 MG PO TABS
1.0000 | ORAL_TABLET | Freq: Once | ORAL | Status: AC
  Administered 2016-08-28: 1 via ORAL
  Filled 2016-08-28: qty 1

## 2016-08-28 MED ORDER — IBUPROFEN 800 MG PO TABS
800.0000 mg | ORAL_TABLET | Freq: Once | ORAL | Status: AC
  Administered 2016-08-28: 800 mg via ORAL
  Filled 2016-08-28: qty 1

## 2016-08-28 MED ORDER — HYDROCODONE-ACETAMINOPHEN 5-325 MG PO TABS: ORAL_TABLET | ORAL | 0 refills | Status: DC

## 2016-08-28 MED ORDER — NAPROXEN 500 MG PO TABS: 500.0000 mg | ORAL_TABLET | Freq: Two times a day (BID) | ORAL | 0 refills | Status: DC

## 2016-08-28 NOTE — Discharge Instructions (Signed)
Apply ice packs on/off to your neck and back.  Follow-up with your primary doctor for recheck.
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
## 2016-08-28 NOTE — ED Notes (Signed)
Pt placed in c-collar. On and aligned.  
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
## 2016-08-29 ENCOUNTER — Encounter: Payer: Self-pay | Admitting: Family Medicine

## 2016-08-29 ENCOUNTER — Other Ambulatory Visit: Payer: Self-pay | Admitting: Family Medicine

## 2016-08-29 ENCOUNTER — Ambulatory Visit (INDEPENDENT_AMBULATORY_CARE_PROVIDER_SITE_OTHER): Payer: PRIVATE HEALTH INSURANCE | Admitting: Family Medicine

## 2016-08-29 VITALS — BP 118/72 | Ht 72.0 in | Wt 195.4 lb

## 2016-08-29 DIAGNOSIS — M542 Cervicalgia: Secondary | ICD-10-CM

## 2016-08-29 DIAGNOSIS — M546 Pain in thoracic spine: Secondary | ICD-10-CM

## 2016-08-29 MED ORDER — NAPROXEN 500 MG PO TABS: 500.0000 mg | ORAL_TABLET | Freq: Two times a day (BID) | ORAL | 0 refills | Status: DC

## 2016-08-29 MED ORDER — HYDROCODONE-ACETAMINOPHEN 5-325 MG PO TABS
ORAL_TABLET | ORAL | 0 refills | Status: DC
Start: 2016-08-29 — End: 2017-05-21

## 2016-08-29 NOTE — Telephone Encounter (Signed)
Ok six mo worth 
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
## 2016-08-29 NOTE — Telephone Encounter (Signed)
Last seen 05/08/16

## 2016-09-03 ENCOUNTER — Encounter: Payer: Self-pay | Admitting: Family Medicine

## 2016-09-07 ENCOUNTER — Encounter: Payer: Self-pay | Admitting: Family Medicine

## 2017-04-21 ENCOUNTER — Other Ambulatory Visit: Payer: Self-pay | Admitting: Family Medicine

## 2017-04-22 NOTE — Telephone Encounter (Signed)
Ok times one with two ref 

## 2017-05-21 ENCOUNTER — Encounter: Payer: Self-pay | Admitting: Family Medicine

## 2017-05-21 ENCOUNTER — Encounter (INDEPENDENT_AMBULATORY_CARE_PROVIDER_SITE_OTHER): Payer: Self-pay | Admitting: Internal Medicine

## 2017-05-21 ENCOUNTER — Ambulatory Visit (INDEPENDENT_AMBULATORY_CARE_PROVIDER_SITE_OTHER): Payer: BLUE CROSS/BLUE SHIELD | Admitting: Family Medicine

## 2017-05-21 VITALS — BP 124/82 | Ht 72.0 in | Wt 207.0 lb

## 2017-05-21 DIAGNOSIS — Z0001 Encounter for general adult medical examination with abnormal findings: Secondary | ICD-10-CM

## 2017-05-21 DIAGNOSIS — Z13220 Encounter for screening for lipoid disorders: Secondary | ICD-10-CM | POA: Diagnosis not present

## 2017-05-21 DIAGNOSIS — Z125 Encounter for screening for malignant neoplasm of prostate: Secondary | ICD-10-CM | POA: Diagnosis not present

## 2017-05-21 DIAGNOSIS — F5101 Primary insomnia: Secondary | ICD-10-CM

## 2017-05-21 DIAGNOSIS — R5383 Other fatigue: Secondary | ICD-10-CM | POA: Diagnosis not present

## 2017-05-21 DIAGNOSIS — R1310 Dysphagia, unspecified: Secondary | ICD-10-CM | POA: Diagnosis not present

## 2017-05-21 DIAGNOSIS — E038 Other specified hypothyroidism: Secondary | ICD-10-CM

## 2017-05-21 DIAGNOSIS — Z79899 Other long term (current) drug therapy: Secondary | ICD-10-CM | POA: Diagnosis not present

## 2017-05-21 DIAGNOSIS — Z0000 Encounter for general adult medical examination without abnormal findings: Secondary | ICD-10-CM

## 2017-05-21 MED ORDER — DIAZEPAM 5 MG PO TABS: ORAL_TABLET | ORAL | 0 refills | Status: DC

## 2017-05-21 MED ORDER — LEVOTHYROXINE SODIUM 75 MCG PO TABS: 75.0000 ug | ORAL_TABLET | Freq: Every day | ORAL | 11 refills | Status: DC

## 2017-05-21 MED ORDER — CEFPROZIL 500 MG PO TABS: 500.0000 mg | ORAL_TABLET | Freq: Two times a day (BID) | ORAL | 0 refills | Status: DC

## 2017-05-21 MED ORDER — HYDROCODONE-HOMATROPINE 5-1.5 MG/5ML PO SYRP: ORAL_SOLUTION | ORAL | 0 refills | Status: DC
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
## 2017-05-26 LAB — LIPID PANEL
Chol/HDL Ratio: 5.6 ratio — ABNORMAL HIGH (ref 0.0–5.0)
Cholesterol, Total: 189 mg/dL (ref 100–199)
HDL: 34 mg/dL — ABNORMAL LOW (ref >39)
LDL Calculated: 108 mg/dL — ABNORMAL HIGH (ref 0–99)
Triglycerides: 234 mg/dL — ABNORMAL HIGH (ref 0–149)
VLDL Cholesterol Cal: 47 mg/dL — ABNORMAL HIGH (ref 5–40)

## 2017-05-26 LAB — BASIC METABOLIC PANEL
BUN/Creatinine Ratio: 20 (ref 9–20)
BUN: 20 mg/dL (ref 6–24)
CO2: 24 mmol/L (ref 20–29)
Calcium: 8.7 mg/dL (ref 8.7–10.2)
Chloride: 108 mmol/L — ABNORMAL HIGH (ref 96–106)
Creatinine, Ser: 1 mg/dL (ref 0.76–1.27)
GFR calc Af Amer: 97 mL/min/{1.73_m2} (ref >59)
GFR calc non Af Amer: 84 mL/min/{1.73_m2} (ref >59)
Glucose: 97 mg/dL (ref 65–99)
Potassium: 5 mmol/L (ref 3.5–5.2)
Sodium: 145 mmol/L — ABNORMAL HIGH (ref 134–144)

## 2017-05-26 LAB — HEPATIC FUNCTION PANEL
ALT: 17 IU/L (ref 0–44)
AST: 19 IU/L (ref 0–40)
Albumin: 4.2 g/dL (ref 3.5–5.5)
Alkaline Phosphatase: 62 IU/L (ref 39–117)
Bilirubin Total: 0.3 mg/dL (ref 0.0–1.2)
Bilirubin, Direct: 0.11 mg/dL (ref 0.00–0.40)
Total Protein: 6.3 g/dL (ref 6.0–8.5)

## 2017-05-26 LAB — PSA: Prostate Specific Ag, Serum: 0.6 ng/mL (ref 0.0–4.0)

## 2017-05-26 LAB — TSH: TSH: 2.64 u[IU]/mL (ref 0.450–4.500)

## 2017-05-27 ENCOUNTER — Telehealth: Payer: Self-pay | Admitting: Family Medicine

## 2017-05-27 MED ORDER — HYDROCODONE-HOMATROPINE 5-1.5 MG/5ML PO SYRP: ORAL_SOLUTION | ORAL | 0 refills | Status: DC

## 2017-05-27 NOTE — Telephone Encounter (Signed)
Spoke with patient and patient stated that he has a productive cough. Denies shortness of breath, and wheezing. Please advise?

## 2017-05-27 NOTE — Telephone Encounter (Signed)
Biometrics form in yellow folder in Dr.Steve's office

## 2017-05-27 NOTE — Telephone Encounter (Signed)
Requesting Rx for hydromet.  Also, patients spouse dropped off biometric screening to be completed.  She is hoping this can be done ASAP if Dr. Lorin PicketScott can do so.  See in nurse forms basket.

## 2017-05-27 NOTE — Telephone Encounter (Signed)
May have a prescription for Hycodan cough medication, 90 mL's, 1 teaspoon every 6 hours as needed not for long-term use (no further refills unless patient is seen regarding this medicine)

## 2017-05-27 NOTE — Telephone Encounter (Signed)
Vm not set up yet, tried calling him to notify that we have sent in the mediation.

## 2017-05-30 NOTE — Telephone Encounter (Signed)
This was signed as requested

## 2017-05-31 NOTE — Telephone Encounter (Signed)
Pt is aware.  

## 2017-06-04 ENCOUNTER — Encounter: Payer: Self-pay | Admitting: Family Medicine

## 2017-06-07 ENCOUNTER — Encounter: Payer: Self-pay | Admitting: Family Medicine

## 2017-07-10 ENCOUNTER — Encounter: Payer: Self-pay | Admitting: Family Medicine

## 2017-07-10 ENCOUNTER — Ambulatory Visit (INDEPENDENT_AMBULATORY_CARE_PROVIDER_SITE_OTHER): Payer: BLUE CROSS/BLUE SHIELD | Admitting: Family Medicine

## 2017-07-10 VITALS — BP 128/80 | Temp 98.4°F | Ht 72.0 in | Wt 199.0 lb

## 2017-07-10 DIAGNOSIS — J019 Acute sinusitis, unspecified: Secondary | ICD-10-CM

## 2017-07-10 MED ORDER — AMOXICILLIN-POT CLAVULANATE 875-125 MG PO TABS: 1.0000 | ORAL_TABLET | Freq: Two times a day (BID) | ORAL | 0 refills | Status: DC

## 2017-07-10 MED ORDER — HYDROCODONE-HOMATROPINE 5-1.5 MG/5ML PO SYRP: ORAL_SOLUTION | ORAL | 0 refills | Status: DC

## 2017-07-10 NOTE — Progress Notes (Signed)
   Subjective:    Patient ID: Brad Rosales, male    DOB: 02/04/1961, 57 y.o.   MRN: [id number]  Sinusitis  This is a new problem. Episode onset: 2 months. Associated symptoms include congestion, coughing and ear pain.  Viral-like illness multiple days head congestion drainage also has had allergy symptoms for several weeks denies wheezing difficulty breathing denies high fever chills sweats Would like a refill on hycodan cough syrup.    Review of Systems  Constitutional: Negative for activity change and fever.  HENT: Positive for congestion, ear pain and rhinorrhea.   Eyes: Negative for discharge.  Respiratory: Positive for cough. Negative for wheezing.   Cardiovascular: Negative for chest pain.  Gastrointestinal: Negative for abdominal pain and vomiting.  Neurological: Negative for weakness.  Psychiatric/Behavioral: Negative for confusion.       Objective:   Physical Exam  Constitutional: He appears well-developed.  HENT:  Head: Normocephalic.  Mouth/Throat: Oropharynx is clear and moist. No oropharyngeal exudate.  Neck: Normal range of motion.  Cardiovascular: Normal rate, regular rhythm and normal heart sounds.  No murmur heard. Pulmonary/Chest: Effort normal and breath sounds normal. He has no wheezes.  Lymphadenopathy:    He has no cervical adenopathy.  Neurological: He exhibits normal muscle tone.  Skin: Skin is warm and dry.  Nursing note and vitals reviewed.         Assessment & Plan:  Cough medication only for home use caution drowsiness Antibiotics prescribed for possible sinus infection Flonase on a regular basis along with humidifier or saline follow-up if progressive troubles or if worse  Patient was seen today for upper respiratory illness. It is felt that the patient is dealing with sinusitis. Antibiotics were prescribed today. Importance of compliance with medication was discussed. Symptoms should gradually resolve over the course of the next several  days. If high fevers, progressive illness, difficulty breathing, worsening condition or failure for symptoms to improve over the next several days then the patient is to follow-up. If any emergent conditions the patient is to follow-up in the emergency department otherwise to follow-up in the office.

## 2017-07-23 ENCOUNTER — Ambulatory Visit (INDEPENDENT_AMBULATORY_CARE_PROVIDER_SITE_OTHER): Payer: BLUE CROSS/BLUE SHIELD | Admitting: Internal Medicine

## 2017-07-23 ENCOUNTER — Encounter (INDEPENDENT_AMBULATORY_CARE_PROVIDER_SITE_OTHER): Payer: Self-pay | Admitting: Internal Medicine

## 2017-07-23 ENCOUNTER — Ambulatory Visit (INDEPENDENT_AMBULATORY_CARE_PROVIDER_SITE_OTHER): Payer: Self-pay | Admitting: Internal Medicine

## 2017-07-23 VITALS — BP 110/74 | HR 68 | Temp 98.1°F | Resp 18 | Ht 72.0 in | Wt 199.0 lb

## 2017-07-23 DIAGNOSIS — R1310 Dysphagia, unspecified: Secondary | ICD-10-CM

## 2017-07-23 DIAGNOSIS — K219 Gastro-esophageal reflux disease without esophagitis: Secondary | ICD-10-CM

## 2017-07-23 DIAGNOSIS — Z1211 Encounter for screening for malignant neoplasm of colon: Secondary | ICD-10-CM | POA: Diagnosis not present

## 2017-07-23 DIAGNOSIS — R1319 Other dysphagia: Secondary | ICD-10-CM

## 2017-07-23 MED ORDER — PANTOPRAZOLE SODIUM 40 MG PO TBEC: 40.0000 mg | DELAYED_RELEASE_TABLET | Freq: Every day | ORAL | 5 refills | Status: DC

## 2017-07-23 NOTE — Progress Notes (Signed)
Reason for consultation;  Solid food dysphagia. Patient also interested in screening colonoscopy.  History of present illness:  Brad Rosales is 57 year old Caucasian male who is referred through courtesy of Dr. Brett CanalesSteve looking for GI evaluation. Patient states his dysphagia started 3 years ago.  It has been intermittent.  Lately it has been occurring more frequently.  He points to lower sternal area as site of bolus obstruction.  He has had a few occasions where he had to regurgitate in order to get relief.  He has no difficulty with liquids.  He also complains of daily heartburn and is using baking soda virtually every day.  He reports he has more problems swallowing when he is under stress or upset.  He has good appetite and has not lost any weight.  He also denies hoarseness sore throat. He has a history of recurrent sinusitis and just finished Augmentin. Patient denies abdominal pain melena or rectal bleeding.  He is also interested in undergoing screening colonoscopy.  He has been advised by Dr. Lubertha SouthSteve luking in the past and he is finally decided to proceed with it. His weight has been stable.  He is very active.  [facility] and he plans to resume exercise soon.   Current Medications: Outpatient Encounter Medications as of 07/23/2017  Medication Sig  . diazepam (VALIUM) 5 MG tablet TAKE 1 TABLET BY MOUTH AT BEDTIME AS NEEDED FOR MUSCLE SPASM FOR ANXIETY  . levothyroxine (SYNTHROID, LEVOTHROID) 75 MCG tablet Take 1 tablet (75 mcg total) by mouth daily.  . [DISCONTINUED] amoxicillin-clavulanate (AUGMENTIN) 875-125 MG tablet Take 1 tablet by mouth 2 (two) times daily. (Patient not taking: Reported on 07/23/2017)  . [DISCONTINUED] HYDROcodone-homatropine (HYCODAN) 5-1.5 MG/5ML syrup One teaspoon po Q 6 hours prn cough (Patient not taking: Reported on 07/23/2017)   No facility-administered encounter medications on file as of 07/23/2017.    Past medical  history:  Hypothyroidism diagnosed in 1997. 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 History of sinusitis.  Status post surgery in 1999. Bilateral thumb surgery for ligament injury in 2007. Right inguinal hernia raphe in September 2017. Excision of right spermatocele on 07/02/2017 at [facility].  Allergies: Allergies  Allergen Reactions  . Ciprofloxacin     Headache and turns eyes yellow     Family history:  Father is 57 years old and has coronary artery disease and achalasia and presently under treatment for spinal abscess or infection [facility]. Mother is [id number].  She has hypothyroidism dementia and glaucoma. He has 1 brother in good health.  Social history:  Patient is married(second marriage). He has 1 son in good health. He is presently working with [employer] [place].  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  He does not smoke cigarettes or drink alcohol.   Physical examination:  Blood pressure 110/74, pulse 68, temperature 98.1 F (36.7 C), temperature source Oral, resp. rate 18, height 6' (1.829 m), weight 199 lb (90.3 kg). Patient is alert and in no acute distress. Conjunctiva is pink. Sclera is nonicteric Oropharyngeal mucosa is normal. No neck masses or thyromegaly noted. Cardiac exam with regular rhythm normal S1 and S2. No murmur or gallop noted. Lungs are clear to auscultation. Abdomen is symmetrical soft and nontender without organomegaly or masses. No LE edema or clubbing noted.  Assessment:  #1.  Esophageal dysphagia.  He possibly has Schatzki's ring.  Given history of  heartburn he could also have esophageal stricture.  It is interesting to know that his father has achalasia which he could also have.  #2.  GERD.  He is having daily heartburn and using baking soda.  Given history of heartburn he may well have esophageal stricture.  He needs to be on PPI at least short-term.  #3.  Patient is average risk for colorectal  carcinoma.  He has only decided to proceed with screening colonoscopy.  Recommendations:  Pantoprazole 40 mg p.o. every morning. Esophagogastroduodenoscopy with esophageal dilation and average risk screening colonoscopy to be scheduled under monitored anesthesia care in near future. I reviewed both the procedure and risks with the patient and he is agreeable.

## 2017-07-23 NOTE — Patient Instructions (Signed)
Esophagogastroduodenoscopy and esophageal dilation and colonoscopy to be scheduled.

## 2017-07-26 ENCOUNTER — Other Ambulatory Visit: Payer: Self-pay

## 2017-07-26 MED ORDER — LEVOTHYROXINE SODIUM 75 MCG PO TABS: 75.0000 ug | ORAL_TABLET | Freq: Every day | ORAL | 11 refills | Status: DC

## 2017-08-05 ENCOUNTER — Other Ambulatory Visit (INDEPENDENT_AMBULATORY_CARE_PROVIDER_SITE_OTHER): Payer: Self-pay | Admitting: *Deleted

## 2017-08-05 DIAGNOSIS — R1319 Other dysphagia: Secondary | ICD-10-CM

## 2017-08-05 DIAGNOSIS — Z1211 Encounter for screening for malignant neoplasm of colon: Secondary | ICD-10-CM

## 2017-08-05 DIAGNOSIS — R1310 Dysphagia, unspecified: Secondary | ICD-10-CM

## 2017-08-06 ENCOUNTER — Encounter (INDEPENDENT_AMBULATORY_CARE_PROVIDER_SITE_OTHER): Payer: Self-pay | Admitting: *Deleted

## 2017-08-06 ENCOUNTER — Other Ambulatory Visit (INDEPENDENT_AMBULATORY_CARE_PROVIDER_SITE_OTHER): Payer: Self-pay | Admitting: *Deleted

## 2017-08-06 DIAGNOSIS — Z1211 Encounter for screening for malignant neoplasm of colon: Secondary | ICD-10-CM

## 2017-08-06 DIAGNOSIS — R1310 Dysphagia, unspecified: Secondary | ICD-10-CM | POA: Insufficient documentation

## 2017-08-06 DIAGNOSIS — R1319 Other dysphagia: Secondary | ICD-10-CM

## 2017-08-07 ENCOUNTER — Telehealth (INDEPENDENT_AMBULATORY_CARE_PROVIDER_SITE_OTHER): Payer: Self-pay | Admitting: *Deleted

## 2017-08-07 ENCOUNTER — Telehealth (INDEPENDENT_AMBULATORY_CARE_PROVIDER_SITE_OTHER): Payer: Self-pay | Admitting: Internal Medicine

## 2017-08-07 MED ORDER — PEG 3350-KCL-NA BICARB-NACL 420 G PO SOLR: 4000.0000 mL | Freq: Once | ORAL | 0 refills | Status: AC

## 2017-08-07 NOTE — Telephone Encounter (Signed)
This has been sent to his pharmacy. Did not sent laxative or enema

## 2017-08-07 NOTE — Telephone Encounter (Signed)
Sent to their pharmacy

## 2017-08-07 NOTE — Telephone Encounter (Signed)
Please send [facility] prep to [facility]  Ask them to give them also 1 fleet enema and a box of Ducolax laxatives too
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
## 2017-08-14 NOTE — Patient Instructions (Signed)
TEDFORD BERG  08/14/2017     @PREFPERIOPPHARMACY @   Your procedure is scheduled on  08/23/2017   Report to [facility] at  830   A.M.  Call this number if you have problems the morning of surgery:  [phone]   Remember:  Do not eat food or drink liquids after midnight.  Take these medicines the morning of surgery with A SIP OF WATER  Levothyroxine, protonix.   Do not wear jewelry, make-up or nail polish.  Do not wear lotions, powders, or perfumes, or deodorant.  Do not shave 48 hours prior to surgery.  Men may shave face and neck.  Do not bring valuables to the hospital.  [facility] is not responsible for any belongings or valuables.  Contacts, dentures or bridgework may not be worn into surgery.  Leave your suitcase in the car.  After surgery it may be brought to your room.  For patients admitted to the hospital, discharge time will be determined by your treatment team.  Patients discharged the day of surgery will not be allowed to drive home.   Name and phone number of your driver:   family Special instructions:  Follow the diet and prep instructions given to you by Dr Patty Sermons office.  Please read over the following fact sheets that you were given. Anesthesia Post-op Instructions and Care and Recovery After Surgery       Esophagogastroduodenoscopy Esophagogastroduodenoscopy (EGD) is a procedure to examine the lining of the esophagus, stomach, and first part of the small intestine (duodenum). This procedure is done to check for problems such as inflammation, bleeding, ulcers, or growths. During this procedure, a long, flexible, lighted tube with a camera attached (endoscope) is inserted down the throat. Tell a health care provider about:  Any allergies you have.  All medicines you are taking, including vitamins, herbs, eye drops, creams, and over-the-counter medicines.  Any problems you or family members have had with anesthetic  medicines.  Any blood disorders you have.  Any surgeries you have had.  Any medical conditions you have.  Whether you are pregnant or may be pregnant. What are the risks? Generally, this is a safe procedure. However, problems may occur, including:  Infection.  Bleeding.  A tear (perforation) in the esophagus, stomach, or duodenum.  Trouble breathing.  Excessive sweating.  Spasms of the larynx.  A slowed heartbeat.  Low blood pressure.  What happens before the procedure?  Follow instructions from your health care provider about eating or drinking restrictions.  Ask your health care provider about: ? Changing or stopping your regular medicines. This is especially important if you are taking diabetes medicines or blood thinners. ? Taking medicines such as aspirin and ibuprofen. These medicines can thin your blood. Do not take these medicines before your procedure if your health care provider instructs you not to.  Plan to have someone take you home after the procedure.  If you wear dentures, be ready to remove them before the procedure. What happens during the procedure?  To reduce your risk of infection, your health care team will wash or sanitize their hands.  An IV tube will be put in a vein in your hand or arm. You will get medicines and fluids through this tube.  You will be given one or more of the following: ? A medicine to help you relax (sedative). ? A medicine to numb the area (local anesthetic).  This medicine may be sprayed into your throat. It will make you feel more comfortable and keep you from gagging or coughing during the procedure. ? A medicine for pain.  A mouth guard may be placed in your mouth to protect your teeth and to keep you from biting on the endoscope.  You will be asked to lie on your left side.  The endoscope will be lowered down your throat into your esophagus, stomach, and duodenum.  Air will be put into the endoscope. This will  help your health care provider see better.  The lining of your esophagus, stomach, and duodenum will be examined.  Your health care provider may: ? Take a tissue sample so it can be looked at in a lab (biopsy). ? Remove growths. ? Remove objects (foreign bodies) that are stuck. ? Treat any bleeding with medicines or other devices that stop tissue from bleeding. ? Widen (dilate) or stretch narrowed areas of your esophagus and stomach.  The endoscope will be taken out. The procedure may vary among health care providers and hospitals. What happens after the procedure?  Your blood pressure, heart rate, breathing rate, and blood oxygen level will be monitored often until the medicines you were given have worn off.  Do not eat or drink anything until the numbing medicine has worn off and your gag reflex has returned. This information is not intended to replace advice given to you by your health care provider. Make sure you discuss any questions you have with your health care provider. Document Released: 10/12/2004 Document Revised: 11/17/2015 Document Reviewed: 05/05/2015 Elsevier Interactive Patient Education  2018 [employer]. Esophagogastroduodenoscopy, Care After Refer to this sheet in the next few weeks. These instructions provide you with information about caring for yourself after your procedure. Your health care provider may also give you more specific instructions. Your treatment has been planned according to current medical practices, but problems sometimes occur. Call your health care provider if you have any problems or questions after your procedure. What can I expect after the procedure? After the procedure, it is common to have:  A sore throat.  Nausea.  Bloating.  Dizziness.  Fatigue.  Follow these instructions at home:  Do not eat or drink anything until the numbing medicine (local anesthetic) has worn off and your gag reflex has returned. You will know that the  local anesthetic has worn off when you can swallow comfortably.  Do not drive for 24 hours if you received a medicine to help you relax (sedative).  If your health care provider took a tissue sample for testing during the procedure, make sure to get your test results. This is your responsibility. Ask your health care provider or the department performing the test when your results will be ready.  Keep all follow-up visits as told by your health care provider. This is important. Contact a health care provider if:  You cannot stop coughing.  You are not urinating.  You are urinating less than usual. Get help right away if:  You have trouble swallowing.  You cannot eat or drink.  You have throat or chest pain that gets worse.  You are dizzy or light-headed.  You faint.  You have nausea or vomiting.  You have chills.  You have a fever.  You have severe abdominal pain.  You have black, tarry, or bloody stools. This information is not intended to replace advice given to you by your health care provider. Make sure you discuss any questions  you have with your health care provider. Document Released: 05/28/2012 Document Revised: 11/17/2015 Document Reviewed: 05/05/2015 Elsevier Interactive Patient Education  2018 [employer].  Esophageal Dilatation Esophageal dilatation is a procedure to open a blocked or narrowed part of the esophagus. The esophagus is the long tube in your throat that carries food and liquid from your mouth to your stomach. The procedure is also called esophageal dilation. You may need this procedure if you have a buildup of scar tissue in your esophagus that makes it difficult, painful, or even impossible to swallow. This can be caused by gastroesophageal reflux disease (GERD). In rare cases, people need this procedure because they have cancer of the esophagus or a problem with the way food moves through the esophagus. Sometimes you may need to have another  dilatation to enlarge the opening of the esophagus gradually. Tell a health care provider about:  Any allergies you have.  All medicines you are taking, including vitamins, herbs, eye drops, creams, and over-the-counter medicines.  Any problems you or family members have had with anesthetic medicines.  Any blood disorders you have.  Any surgeries you have had.  Any medical conditions you have.  Any antibiotic medicines you are required to take before dental procedures. What are the risks? Generally, this is a safe procedure. However, problems can occur and include:  Bleeding from a tear in the lining of the esophagus.  A hole (perforation) in the esophagus.  What happens before the procedure?  Do not eat or drink anything after midnight on the night before the procedure or as directed by your health care provider.  Ask your health care provider about changing or stopping your regular medicines. This is especially important if you are taking diabetes medicines or blood thinners.  Plan to have someone take you home after the procedure. What happens during the procedure?  You will be given a medicine that makes you relaxed and sleepy (sedative).  A medicine may be sprayed or gargled to numb the back of the throat.  Your health care provider can use various instruments to do an esophageal dilatation. During the procedure, the instrument used will be placed in your mouth and passed down into your esophagus. Options include: ? Simple dilators. This instrument is carefully placed in the esophagus to stretch it. ? Guided wire bougies. In this method, a flexible tube (endoscope) is used to insert a wire into the esophagus. The dilator is passed over this wire to enlarge the esophagus. Then the wire is removed. ? Balloon dilators. An endoscope with a small balloon at the end is passed down into the esophagus. Inflating the balloon gently stretches the esophagus and opens it up. What  happens after the procedure?  Your blood pressure, heart rate, breathing rate, and blood oxygen level will be monitored often until the medicines you were given have worn off.  Your throat may feel slightly sore and will probably still feel numb. This will improve slowly over time.  You will not be allowed to eat or drink until the throat numbness has resolved.  If this is a same-day procedure, you may be allowed to go home once you have been able to drink, urinate, and sit on the edge of the bed without nausea or dizziness.  If this is a same-day procedure, you should have a friend or family member with you for the next 24 hours after the procedure. This information is not intended to replace advice given to you by your health  care provider. Make sure you discuss any questions you have with your health care provider. Document Released: 08/02/2005 Document Revised: 11/17/2015 Document Reviewed: 10/21/2013 Elsevier Interactive Patient Education  [employer]2018 [employer]  Colonoscopy, Adult A colonoscopy is an exam to look at the large intestine. It is done to check for problems, such as:  Lumps (tumors).  Growths (polyps).  Swelling (inflammation).  Bleeding.  What happens before the procedure? Eating and drinking Follow instructions from your doctor about eating and drinking. These instructions may include:  A few days before the procedure - follow a low-fiber diet. ? Avoid nuts. ? Avoid seeds. ? Avoid dried fruit. ? Avoid raw fruits. ? Avoid vegetables.  1-3 days before the procedure - follow a clear liquid diet. Avoid liquids that have red or purple dye. Drink only clear liquids, such as: ? Clear broth or bouillon. ? Black coffee or tea. ? Clear juice. ? Clear soft drinks or sports drinks. ? Gelatin dessert. ? Popsicles.  On the day of the procedure - do not eat or drink anything during the 2 hours before the procedure.  Bowel prep If you were prescribed an oral bowel  prep:  Take it as told by your doctor. Starting the day before your procedure, you will need to drink a lot of liquid. The liquid will cause you to poop (have bowel movements) until your poop is almost clear or light green.  If your skin or butt gets irritated from diarrhea, you may: ? Wipe the area with wipes that have medicine in them, such as adult wet wipes with aloe and vitamin E. ? Put something on your skin that soothes the area, such as petroleum jelly.  If you throw up (vomit) while drinking the bowel prep, take a break for up to 60 minutes. Then begin the bowel prep again. If you keep throwing up and you cannot take the bowel prep without throwing up, call your doctor.  General instructions  Ask your doctor about changing or stopping your normal medicines. This is important if you take diabetes medicines or blood thinners.  Plan to have someone take you home from the hospital or clinic. What happens during the procedure?  An IV tube may be put into one of your veins.  You will be given medicine to help you relax (sedative).  To reduce your risk of infection: ? Your doctors will wash their hands. ? Your anal area will be washed with soap.  You will be asked to lie on your side with your knees bent.  Your doctor will get a long, thin, flexible tube ready. The tube will have a camera and a light on the end.  The tube will be put into your anus.  The tube will be gently put into your large intestine.  Air will be delivered into your large intestine to keep it open. You may feel some pressure or cramping.  The camera will be used to take photos.  A small tissue sample may be removed from your body to be looked at under a microscope (biopsy). If any possible problems are found, the tissue will be sent to a lab for testing.  If small growths are found, your doctor may remove them and have them checked for cancer.  The tube that was put into your anus will be slowly  removed. The procedure may vary among doctors and hospitals. What happens after the procedure?  Your doctor will check on you often until the medicines you were  given have worn off.  Do not drive for 24 hours after the procedure.  You may have a small amount of blood in your poop.  You may pass gas.  You may have mild cramps or bloating in your belly (abdomen).  It is up to you to get the results of your procedure. Ask your doctor, or the department performing the procedure, when your results will be ready. This information is not intended to replace advice given to you by your health care provider. Make sure you discuss any questions you have with your health care provider. Document Released: 07/14/2010 Document Revised: 04/11/2016 Document Reviewed: 08/23/2015 Elsevier Interactive Patient Education  2017 Else[employer] Inc.  Colonoscopy, Adult, Care After This sheet gives you information about how to care for yourself after your procedure. Your health care provider may also give you more specific instructions. If you have problems or questions, contact your health care provider. What can I expect after the procedure? After the procedure, it is common to have:  A small amount of blood in your stool for 24 hours after the procedure.  Some gas.  Mild abdominal cramping or bloating.  Follow these instructions at home: General instructions   For the first 24 hours after the procedure: ? Do not drive or use machinery. ? Do not sign important documents. ? Do not drink alcohol. ? Do your regular daily activities at a slower pace than normal. ? Eat soft, easy-to-digest foods. ? Rest often.  Take over-the-counter or prescription medicines only as told by your health care provider.  It is up to you to get the results of your procedure. Ask your health care provider, or the department performing the procedure, when your results will be ready. Relieving cramping and bloating  Try  walking around when you have cramps or feel bloated.  Apply heat to your abdomen as told by your health care provider. Use a heat source that your health care provider recommends, such as a moist heat pack or a heating pad. ? Place a towel between your skin and the heat source. ? Leave the heat on for 20-30 minutes. ? Remove the heat if your skin turns bright red. This is especially important if you are unable to feel pain, heat, or cold. You may have a greater risk of getting burned. Eating and drinking  Drink enough fluid to keep your urine clear or pale yellow.  Resume your normal diet as instructed by your health care provider. Avoid heavy or fried foods that are hard to digest.  Avoid drinking alcohol for as long as instructed by your health care provider. Contact a health care provider if:  You have blood in your stool 2-3 days after the procedure. Get help right away if:  You have more than a small spotting of blood in your stool.  You pass large blood clots in your stool.  Your abdomen is swollen.  You have nausea or vomiting.  You have a fever.  You have increasing abdominal pain that is not relieved with medicine. This information is not intended to replace advice given to you by your health care provider. Make sure you discuss any questions you have with your health care provider. Document Released: 01/24/2004 Document Revised: 03/05/2016 Document Reviewed: 08/23/2015 Elsevier Interactive Patient Education  2018 [employer]  Monitored Anesthesia Care Anesthesia is a term that refers to techniques, procedures, and medicines that help a person stay safe and comfortable during a medical procedure. Monitored anesthesia care, or sedation,  is one type of anesthesia. Your anesthesia specialist may recommend sedation if you will be having a procedure that does not require you to be unconscious, such as:  Cataract surgery.  A dental procedure.  A biopsy.  A  colonoscopy.  During the procedure, you may receive a medicine to help you relax (sedative). There are three levels of sedation:  Mild sedation. At this level, you may feel awake and relaxed. You will be able to follow directions.  Moderate sedation. At this level, you will be sleepy. You may not remember the procedure.  Deep sedation. At this level, you will be asleep. You will not remember the procedure.  The more medicine you are given, the deeper your level of sedation will be. Depending on how you respond to the procedure, the anesthesia specialist may change your level of sedation or the type of anesthesia to fit your needs. An anesthesia specialist will monitor you closely during the procedure. Let your health care provider know about:  Any allergies you have.  All medicines you are taking, including vitamins, herbs, eye drops, creams, and over-the-counter medicines.  Any use of steroids (by mouth or as a cream).  Any problems you or family members have had with sedatives and anesthetic medicines.  Any blood disorders you have.  Any surgeries you have had.  Any medical conditions you have, such as sleep apnea.  Whether you are pregnant or may be pregnant.  Any use of cigarettes, alcohol, or street drugs. What are the risks? Generally, this is a safe procedure. However, problems may occur, including:  Getting too much medicine (oversedation).  Nausea.  Allergic reaction to medicines.  Trouble breathing. If this happens, a breathing tube may be used to help with breathing. It will be removed when you are awake and breathing on your own.  Heart trouble.  Lung trouble.  Before the procedure Staying hydrated Follow instructions from your health care provider about hydration, which may include:  Up to 2 hours before the procedure - you may continue to drink clear liquids, such as water, clear fruit juice, black coffee, and plain tea.  Eating and drinking  restrictions Follow instructions from your health care provider about eating and drinking, which may include:  8 hours before the procedure - stop eating heavy meals or foods such as meat, fried foods, or fatty foods.  6 hours before the procedure - stop eating light meals or foods, such as toast or cereal.  6 hours before the procedure - stop drinking milk or drinks that contain milk.  2 hours before the procedure - stop drinking clear liquids.  Medicines Ask your health care provider about:  Changing or stopping your regular medicines. This is especially important if you are taking diabetes medicines or blood thinners.  Taking medicines such as aspirin and ibuprofen. These medicines can thin your blood. Do not take these medicines before your procedure if your health care provider instructs you not to.  Tests and exams  You will have a physical exam.  You may have blood tests done to show: ? How well your kidneys and liver are working. ? How well your blood can clot.  General instructions  Plan to have someone take you home from the hospital or clinic.  If you will be going home right after the procedure, plan to have someone with you for 24 hours.  What happens during the procedure?  Your blood pressure, heart rate, breathing, level of pain and overall condition will  be monitored.  An IV tube will be inserted into one of your veins.  Your anesthesia specialist will give you medicines as needed to keep you comfortable during the procedure. This may mean changing the level of sedation.  The procedure will be performed. After the procedure  Your blood pressure, heart rate, breathing rate, and blood oxygen level will be monitored until the medicines you were given have worn off.  Do not drive for 24 hours if you received a sedative.  You may: ? Feel sleepy, clumsy, or nauseous. ? Feel forgetful about what happened after the procedure. ? Have a sore throat if you had a  breathing tube during the procedure. ? Vomit. This information is not intended to replace advice given to you by your health care provider. Make sure you discuss any questions you have with your health care provider. Document Released: 03/07/2005 Document Revised: 11/18/2015 Document Reviewed: 10/02/2015 Elsevier Interactive Patient Education  2018 [employer] Monitored Anesthesia Care, Care After These instructions provide you with information about caring for yourself after your procedure. Your health care provider may also give you more specific instructions. Your treatment has been planned according to current medical practices, but problems sometimes occur. Call your health care provider if you have any problems or questions after your procedure. What can I expect after the procedure? After your procedure, it is common to:  Feel sleepy for several hours.  Feel clumsy and have poor balance for several hours.  Feel forgetful about what happened after the procedure.  Have poor judgment for several hours.  Feel nauseous or vomit.  Have a sore throat if you had a breathing tube during the procedure.  Follow these instructions at home: For at least 24 hours after the procedure:   Do not: ? Participate in activities in which you could fall or become injured. ? Drive. ? Use heavy machinery. ? Drink alcohol. ? Take sleeping pills or medicines that cause drowsiness. ? Make important decisions or sign legal documents. ? Take care of children on your own.  Rest. Eating and drinking  Follow the diet that is recommended by your health care provider.  If you vomit, drink water, juice, or soup when you can drink without vomiting.  Make sure you have little or no nausea before eating solid foods. General instructions  Have a responsible adult stay with you until you are awake and alert.  Take over-the-counter and prescription medicines only as told by your health care  provider.  If you smoke, do not smoke without supervision.  Keep all follow-up visits as told by your health care provider. This is important. Contact a health care provider if:  You keep feeling nauseous or you keep vomiting.  You feel light-headed.  You develop a rash.  You have a fever. Get help right away if:  You have trouble breathing. This information is not intended to replace advice given to you by your health care provider. Make sure you discuss any questions you have with your health care provider. Document Released: 10/02/2015 Document Revised: 02/01/2016 Document Reviewed: 10/02/2015 Elsevier Interactive Patient Education  [employer].

## 2017-08-16 ENCOUNTER — Encounter (HOSPITAL_COMMUNITY): Payer: Self-pay

## 2017-08-16 ENCOUNTER — Encounter (HOSPITAL_COMMUNITY)
Admission: RE | Admit: 2017-08-16 | Discharge: 2017-08-16 | Disposition: A | Discharge status: Home / Self Care | Payer: BLUE CROSS/BLUE SHIELD | Source: Ambulatory Visit | Attending: Internal Medicine | Admitting: Internal Medicine

## 2017-08-16 ENCOUNTER — Other Ambulatory Visit: Payer: Self-pay

## 2017-08-16 DIAGNOSIS — Z01812 Encounter for preprocedural laboratory examination: Secondary | ICD-10-CM | POA: Insufficient documentation

## 2017-08-16 DIAGNOSIS — Z01818 Encounter for other preprocedural examination: Secondary | ICD-10-CM | POA: Diagnosis not present

## 2017-08-16 DIAGNOSIS — R9431 Abnormal electrocardiogram [ECG] [EKG]: Secondary | ICD-10-CM | POA: Insufficient documentation

## 2017-08-16 DIAGNOSIS — R1319 Other dysphagia: Secondary | ICD-10-CM

## 2017-08-16 DIAGNOSIS — R001 Bradycardia, unspecified: Secondary | ICD-10-CM | POA: Insufficient documentation

## 2017-08-16 DIAGNOSIS — I4510 Unspecified right bundle-branch block: Secondary | ICD-10-CM | POA: Insufficient documentation

## 2017-08-16 DIAGNOSIS — R1310 Dysphagia, unspecified: Secondary | ICD-10-CM | POA: Diagnosis not present

## 2017-08-16 DIAGNOSIS — Z1211 Encounter for screening for malignant neoplasm of colon: Secondary | ICD-10-CM

## 2017-08-16 LAB — BASIC METABOLIC PANEL
Anion gap: 9 (ref 5–15)
BUN: 21 mg/dL — ABNORMAL HIGH (ref 6–20)
CO2: 25 mmol/L (ref 22–32)
Calcium: 8.9 mg/dL (ref 8.9–10.3)
Chloride: 104 mmol/L (ref 101–111)
Creatinine, Ser: 1.01 mg/dL (ref 0.61–1.24)
GFR calc Af Amer: >60 mL/min (ref >60)
GFR calc non Af Amer: >60 mL/min (ref >60)
Glucose, Bld: 88 mg/dL (ref 65–99)
Potassium: 3.8 mmol/L (ref 3.5–5.1)
Sodium: 138 mmol/L (ref 135–145)

## 2017-08-16 LAB — CBC WITH DIFFERENTIAL/PLATELET
Basophils Absolute: 0 10*3/uL (ref 0.0–0.1)
Basophils Relative: 1 %
Eosinophils Absolute: 0.2 10*3/uL (ref 0.0–0.7)
Eosinophils Relative: 4 %
HCT: 46.2 % (ref 39.0–52.0)
Hemoglobin: 15.8 g/dL (ref 13.0–17.0)
Lymphocytes Relative: 27 %
Lymphs Abs: 1.5 10*3/uL (ref 0.7–4.0)
MCH: 31.5 pg (ref 26.0–34.0)
MCHC: 34.2 g/dL (ref 30.0–36.0)
MCV: 92 fL (ref 78.0–100.0)
Monocytes Absolute: 0.4 10*3/uL (ref 0.1–1.0)
Monocytes Relative: 8 %
Neutro Abs: 3.3 10*3/uL (ref 1.7–7.7)
Neutrophils Relative %: 60 %
Platelets: 191 10*3/uL (ref 150–400)
RBC: 5.02 MIL/uL (ref 4.22–5.81)
RDW: 12.8 % (ref 11.5–15.5)
WBC: 5.5 10*3/uL (ref 4.0–10.5)

## 2017-08-23 ENCOUNTER — Ambulatory Visit (HOSPITAL_COMMUNITY)
Admission: RE | Admit: 2017-08-23 | Discharge: 2017-08-23 | Disposition: A | Discharge status: Home / Self Care | Payer: BLUE CROSS/BLUE SHIELD | Source: Ambulatory Visit | Attending: Internal Medicine | Admitting: Internal Medicine

## 2017-08-23 ENCOUNTER — Ambulatory Visit (HOSPITAL_COMMUNITY): Payer: BLUE CROSS/BLUE SHIELD | Admitting: Anesthesiology

## 2017-08-23 ENCOUNTER — Encounter (HOSPITAL_COMMUNITY): Payer: Self-pay | Admitting: *Deleted

## 2017-08-23 ENCOUNTER — Encounter (HOSPITAL_COMMUNITY)
Admission: RE | Disposition: A | Discharge status: Home / Self Care | Payer: Self-pay | Source: Ambulatory Visit | Attending: Internal Medicine

## 2017-08-23 DIAGNOSIS — K210 Gastro-esophageal reflux disease with esophagitis: Secondary | ICD-10-CM | POA: Insufficient documentation

## 2017-08-23 DIAGNOSIS — K5730 Diverticulosis of large intestine without perforation or abscess without bleeding: Secondary | ICD-10-CM | POA: Insufficient documentation

## 2017-08-23 DIAGNOSIS — Z79899 Other long term (current) drug therapy: Secondary | ICD-10-CM | POA: Insufficient documentation

## 2017-08-23 DIAGNOSIS — Z1211 Encounter for screening for malignant neoplasm of colon: Secondary | ICD-10-CM | POA: Diagnosis not present

## 2017-08-23 DIAGNOSIS — K222 Esophageal obstruction: Secondary | ICD-10-CM | POA: Diagnosis not present

## 2017-08-23 DIAGNOSIS — R1314 Dysphagia, pharyngoesophageal phase: Secondary | ICD-10-CM | POA: Insufficient documentation

## 2017-08-23 DIAGNOSIS — R1310 Dysphagia, unspecified: Secondary | ICD-10-CM | POA: Diagnosis not present

## 2017-08-23 DIAGNOSIS — K3189 Other diseases of stomach and duodenum: Secondary | ICD-10-CM | POA: Diagnosis not present

## 2017-08-23 DIAGNOSIS — K2900 Acute gastritis without bleeding: Secondary | ICD-10-CM

## 2017-08-23 DIAGNOSIS — E039 Hypothyroidism, unspecified: Secondary | ICD-10-CM | POA: Diagnosis not present

## 2017-08-23 DIAGNOSIS — R1319 Other dysphagia: Secondary | ICD-10-CM

## 2017-08-23 DIAGNOSIS — K449 Diaphragmatic hernia without obstruction or gangrene: Secondary | ICD-10-CM | POA: Diagnosis not present

## 2017-08-23 HISTORY — PX: COLONOSCOPY WITH PROPOFOL: SHX5780

## 2017-08-23 HISTORY — PX: ESOPHAGEAL DILATION: SHX303

## 2017-08-23 HISTORY — PX: ESOPHAGOGASTRODUODENOSCOPY (EGD) WITH PROPOFOL: SHX5813

## 2017-08-23 SURGERY — COLONOSCOPY WITH PROPOFOL
Anesthesia: Monitor Anesthesia Care

## 2017-08-23 MED ORDER — LIDOCAINE VISCOUS 2 % MT SOLN
OROMUCOSAL | Status: AC
  Filled 2017-08-23: qty 15

## 2017-08-23 MED ORDER — FENTANYL CITRATE (PF) 100 MCG/2ML IJ SOLN
INTRAMUSCULAR | Status: AC
  Filled 2017-08-23: qty 2

## 2017-08-23 MED ORDER — PROPOFOL 10 MG/ML IV BOLUS
INTRAVENOUS | Status: AC
  Filled 2017-08-23: qty 20

## 2017-08-23 MED ORDER — MIDAZOLAM HCL 2 MG/2ML IJ SOLN
1.0000 mg | INTRAMUSCULAR | Status: AC
  Administered 2017-08-23: 2 mg via INTRAVENOUS
  Filled 2017-08-23: qty 2

## 2017-08-23 MED ORDER — LIDOCAINE HCL (PF) 1 % IJ SOLN
INTRAMUSCULAR | Status: AC
  Filled 2017-08-23: qty 5

## 2017-08-23 MED ORDER — FENTANYL CITRATE (PF) 100 MCG/2ML IJ SOLN: INTRAMUSCULAR | Status: DC | PRN

## 2017-08-23 MED ORDER — PROPOFOL 10 MG/ML IV BOLUS
INTRAVENOUS | Status: AC
  Filled 2017-08-23: qty 40

## 2017-08-23 MED ORDER — FENTANYL CITRATE (PF) 100 MCG/2ML IJ SOLN
25.0000 ug | Freq: Once | INTRAMUSCULAR | Status: AC
  Administered 2017-08-23: 25 ug via INTRAVENOUS

## 2017-08-23 MED ORDER — PROPOFOL 500 MG/50ML IV EMUL
INTRAVENOUS | Status: DC | PRN
  Administered 2017-08-23: 150 ug/kg/min via INTRAVENOUS
  Administered 2017-08-23: 10:00:00 via INTRAVENOUS

## 2017-08-23 MED ORDER — LIDOCAINE VISCOUS 2 % MT SOLN
5.0000 mL | Freq: Once | OROMUCOSAL | Status: AC
  Administered 2017-08-23: 5 mL via OROMUCOSAL

## 2017-08-23 MED ORDER — LACTATED RINGERS IV SOLN
INTRAVENOUS | Status: DC
  Administered 2017-08-23: 10:00:00 via INTRAVENOUS

## 2017-08-23 MED ORDER — STERILE WATER FOR IRRIGATION IR SOLN
Status: DC | PRN
  Administered 2017-08-23: 200 mL

## 2017-08-23 MED ORDER — EPHEDRINE SULFATE 50 MG/ML IJ SOLN
INTRAMUSCULAR | Status: DC | PRN
  Administered 2017-08-23: 10 mg via INTRAVENOUS

## 2017-08-23 MED ORDER — OMEPRAZOLE MAGNESIUM 20 MG PO TBEC: 20.0000 mg | DELAYED_RELEASE_TABLET | Freq: Every day | ORAL | 1 refills | Status: DC

## 2017-08-23 NOTE — Anesthesia Preprocedure Evaluation (Signed)
Anesthesia Evaluation  Patient identified by MRN, date of birth, ID band Patient awake    Reviewed: Allergy & Precautions, NPO status , Patient's Chart, lab work & pertinent test results  Airway Mallampati: I  TM Distance: >3 FB Neck ROM: Full    Dental  (+) Teeth Intact   Pulmonary neg pulmonary ROS,    breath sounds clear to auscultation       Cardiovascular negative cardio ROS   Rhythm:Regular Rate:Normal     Neuro/Psych  Headaches,    GI/Hepatic negative GI ROS, Neg liver ROS,   Endo/Other  Hypothyroidism   Renal/GU negative Renal ROS     Musculoskeletal   Abdominal   Peds  Hematology   Anesthesia Other Findings   Reproductive/Obstetrics                             Anesthesia Physical Anesthesia Plan  ASA: II  Anesthesia Plan: MAC   Post-op Pain Management:    Induction: Intravenous  PONV Risk Score and Plan:   Airway Management Planned: Simple Face Mask  Additional Equipment:   Intra-op Plan:   Post-operative Plan:   Informed Consent: I have reviewed the patients History and Physical, chart, labs and discussed the procedure including the risks, benefits and alternatives for the proposed anesthesia with the patient or authorized representative who has indicated his/her understanding and acceptance.     Plan Discussed with:   Anesthesia Plan Comments:         Anesthesia Quick Evaluation

## 2017-08-23 NOTE — Anesthesia Postprocedure Evaluation (Signed)
Anesthesia Post Note  Patient: Brad Rosales  Procedure(s) Performed: COLONOSCOPY WITH PROPOFOL (N/A ) ESOPHAGOGASTRODUODENOSCOPY (EGD) WITH PROPOFOL (N/A ) ESOPHAGEAL DILATION (N/A )  Patient location during evaluation: PACU Anesthesia Type: MAC Level of consciousness: awake and alert, oriented and patient cooperative Pain management: pain level controlled Vital Signs Assessment: post-procedure vital signs reviewed and stable Respiratory status: spontaneous breathing and respiratory function stable Cardiovascular status: stable Postop Assessment: no apparent nausea or vomiting Anesthetic complications: no     Last Vitals:  Vitals:   08/23/17 0950 08/23/17 0955  BP: 110/72 108/74  Resp: 15 20  Temp:    SpO2: 94% 94%    Last Pain:  Vitals:   08/23/17 0931  TempSrc: Oral  PainSc: 2                  Maeven Mcdougall A

## 2017-08-23 NOTE — Op Note (Signed)
[facility] Patient Name: Brad Rosales Procedure Date: 08/23/2017 10:14 AM MRN: [id number] Date of Birth: [birth year] Attending MD: Lionel December , MD CSN: [id number] Age: 57 Admit Type: Outpatient Procedure:                Colonoscopy Indications:              Screening for colorectal malignant neoplasm Providers:                Lionel December, MD, Edrick Kins, RN, Dyann Ruddle Referring MD:             Vilinda Blanks. Luking, MD Medicines:                Propofol per Anesthesia Complications:            No immediate complications. Estimated Blood Loss:     Estimated blood loss: none. Procedure:                Pre-Anesthesia Assessment:                           - Prior to the procedure, a History and Physical                            was performed, and patient medications and                            allergies were reviewed. The patient's tolerance of                            previous anesthesia was also reviewed. The risks                            and benefits of the procedure and the sedation                            options and risks were discussed with the patient.                            All questions were answered, and informed consent                            was obtained. Prior Anticoagulants: The patient has                            taken no previous anticoagulant or antiplatelet                            agents. ASA Grade Assessment: II - A patient with                            mild systemic disease. After reviewing the risks                            and benefits, the patient was deemed in  satisfactory condition to undergo the procedure.                           After obtaining informed consent, the colonoscope                            was passed under direct vision. Throughout the                            procedure, the patient's blood pressure, pulse, and                            oxygen saturations were monitored  continuously. The                            [id number] ([id number]) was introduced through the                            anus and advanced to the the cecum, identified by                            appendiceal orifice and ileocecal valve. The                            colonoscopy was somewhat difficult due to a                            tortuous colon. The patient tolerated the procedure                            well. The quality of the bowel preparation was                            excellent. The ileocecal valve, appendiceal                            orifice, and rectum were photographed. Scope In: 10:18:30 AM Scope Out: 10:36:51 AM Scope Withdrawal Time: 0 hours 9 minutes 34 seconds  Total Procedure Duration: 0 hours 18 minutes 21 seconds  Findings:      The perianal and digital rectal examinations were normal.      A few small-mouthed diverticula were found in the sigmoid colon and       ascending colon.      The exam was otherwise without abnormality.      The retroflexed view of the distal rectum and anal verge was normal and       showed no anal or rectal abnormalities. Impression:               - Diverticulosis in the sigmoid colon and in the                            ascending colon.                           - The examination was otherwise  normal.                           - No specimens collected. Moderate Sedation:      Per Anesthesia Care Recommendation:           - Patient has a contact number available for                            emergencies. The signs and symptoms of potential                            delayed complications were discussed with the                            patient. Return to normal activities tomorrow.                            Written discharge instructions were provided to the                            patient.                           - High fiber diet today.                           - Continue present medications.                            - Repeat colonoscopy in 10 years for screening                            purposes. Procedure Code(s):        --- Professional ---                           [phone], Colonoscopy, flexible; diagnostic, including                            collection of specimen(s) by brushing or washing,                            when performed (separate procedure) Diagnosis Code(s):        --- Professional ---                           Z12.11, Encounter for screening for malignant                            neoplasm of colon                           K57.30, Diverticulosis of large intestine without                            perforation or abscess without bleeding CPT copyright 2016 American Medical Association. All rights  reserved. The codes documented in this report are preliminary and upon coder review may  be revised to meet current compliance requirements. Lionel December, MD Lionel December, MD 08/23/2017 10:51:53 AM This report has been signed electronically. Number of Addenda: 0

## 2017-08-23 NOTE — Anesthesia Procedure Notes (Signed)
Procedure Name: MAC Date/Time: 08/23/2017 10:00 AM Performed by: Andree Elk Amy A, CRNA Pre-anesthesia Checklist: Patient identified, Emergency Drugs available, Suction available, Patient being monitored and Timeout performed Oxygen Delivery Method: Simple face mask

## 2017-08-23 NOTE — Transfer of Care (Signed)
Immediate Anesthesia Transfer of Care Note  Patient: Brad Rosales  Procedure(s) Performed: COLONOSCOPY WITH PROPOFOL (N/A ) ESOPHAGOGASTRODUODENOSCOPY (EGD) WITH PROPOFOL (N/A ) ESOPHAGEAL DILATION (N/A )  Patient Location: PACU  Anesthesia Type:MAC  Level of Consciousness: awake alert cooperative  Airway & Oxygen Therapy: Patient Spontanous Breathing and Patient connected to nasal cannula oxygen  Post-op Assessment: Report given to RN and Post -op Vital signs reviewed and stable  Post vital signs: Reviewed and stable  Last Vitals:  Vitals:   08/23/17 0950 08/23/17 0955  BP: 110/72 108/74  Resp: 15 20  Temp:    SpO2: 94% 94%    Last Pain:  Vitals:   08/23/17 0931  TempSrc: Oral  PainSc: 2       Patients Stated Pain Goal: 4 (24/09/73 5329)  Complications: No apparent anesthesia complications

## 2017-08-23 NOTE — H&P (Signed)
Brad Rosales is an 57 y.o. male.   Chief Complaint: Patient is here for EGD, EGD and colonoscopy. HPI: Patient is 57 year old Caucasian male who was seen in the office 1 month ago for solid food dysphagia and frequent heartburn.  He was begun on pantoprazole.  He says he is not taking baking soda anymore.  He has not had any heartburn since he has been on this medication.  He denies nausea vomiting or throat symptoms.  He also denies melena. He is also undergoing average risk screening colonoscopy.  He denies abdominal pain or rectal bleeding. Family History is negative for CRC.  Past Medical History:  Diagnosis Date  . Allergy    seasonal  . ED (erectile dysfunction)   . Migraines   . Paresthesia 11/24/2015  . Sinus trouble   . Thyroid disease    hyprothyroid    Past Surgical History:  Procedure Laterality Date  . HAND SURGERY Bilateral    Right thumb and Left thumb  . NOSE SURGERY    . scrotal cyst      Family History  Problem Relation Age of Onset  . Hypertension Father   . Congestive Heart Failure Father   . Dementia Mother    Social History:  reports that  has never smoked. he has never used smokeless tobacco. He reports that he does not drink alcohol or use drugs.  Allergies: No Known Allergies  Medications Prior to Admission  Medication Sig Dispense Refill  . BEE POLLEN PO Take 2 capsules by mouth daily.    . Cholecalciferol (VITAMIN D3 PO) Take 1 capsule by mouth daily.    . diazepam (VALIUM) 5 MG tablet TAKE 1 TABLET BY MOUTH AT BEDTIME AS NEEDED FOR MUSCLE SPASM FOR ANXIETY (Patient taking differently: Take 5 mg by mouth at bedtime as needed for anxiety or muscle spasms. ) 30 tablet 0  . levothyroxine (SYNTHROID, LEVOTHROID) 75 MCG tablet Take 1 tablet (75 mcg total) by mouth daily. 30 tablet 11  . pantoprazole (PROTONIX) 40 MG tablet Take 1 tablet (40 mg total) by mouth daily before breakfast. 30 tablet 5    No results found for this or any previous visit (from  the past 48 hour(s)). No results found.  ROS  Blood pressure 110/80, temperature 98.2 F (36.8 C), temperature source Oral, resp. rate (!) 24, SpO2 96 %. Physical Exam  Constitutional: He appears well-developed and well-nourished.  HENT:  Mouth/Throat: Oropharynx is clear and moist.  Eyes: Conjunctivae are normal. No scleral icterus.  Neck: No thyromegaly present.  Cardiovascular: Normal rate and normal heart sounds.  No murmur heard. Respiratory: Effort normal.  GI: Soft. He exhibits no distension and no mass.  Musculoskeletal: He exhibits no edema.  Lymphadenopathy:    He has no cervical adenopathy.  Neurological: He is alert.  Skin: Skin is warm and dry.     Assessment/Plan Solid food dysphagia in patient with GERD. EGD with Ed and average risk screening colonoscopy.  Lionel DecemberNajeeb Ayo Guarino, MD 08/23/2017, 9:53 AM

## 2017-08-23 NOTE — Op Note (Addendum)
[facility] Patient Name: Brad Rosales Procedure Date: 08/23/2017 9:37 AM MRN: [id number] Date of Birth: 05/22/1961 Attending MD: Lionel DecemberNajeeb Ilay Capshaw , MD CSN: [id number] Age: [age] Admit Type: Outpatient Procedure:                Upper GI endoscopy Indications:              Esophageal dysphagia Providers:                Lionel DecemberNajeeb Ndia Sampath, MD, Edrick Kinsammy Vaught, RN, Dyann Ruddleonya Wilson Referring MD:             Vilinda BlanksWilliam S. Luking, MD Medicines:                Lidocaine spray, Propofol per Anesthesia Complications:            No immediate complications. Estimated Blood Loss:     Estimated blood loss: none. Procedure:                Pre-Anesthesia Assessment:                           - Prior to the procedure, a History and Physical                            was performed, and patient medications and                            allergies were reviewed. The patient's tolerance of                            previous anesthesia was also reviewed. The risks                            and benefits of the procedure and the sedation                            options and risks were discussed with the patient.                            All questions were answered, and informed consent                            was obtained. Prior Anticoagulants: The patient has                            taken no previous anticoagulant or antiplatelet                            agents. ASA Grade Assessment: II - A patient with                            mild systemic disease. After reviewing the risks                            and benefits, the patient was deemed in  satisfactory condition to undergo the procedure.                           After obtaining informed consent, the endoscope was                            passed under direct vision. Throughout the                            procedure, the patient's blood pressure, pulse, and                            oxygen saturations were monitored  continuously. The                            [id number] ([id number]) scope was introduced through the                            mouth, and advanced to the second part of duodenum.                            The upper GI endoscopy was accomplished without                            difficulty. The patient tolerated the procedure                            well. Scope In: 10:07:08 AM Scope Out: 10:14:37 AM Total Procedure Duration: 0 hours 7 minutes 29 seconds  Findings:      The examined esophagus was normal.      Esophagitis was found 38 cm( GEJ) from the incisors.      A low-grade of narrowing Schatzki ring (acquired) was found at the       gastroesophageal junction. The scope was withdrawn. Dilation was       performed with a Maloney dilator with no resistance at 56 Fr. The       dilation site was examined following endoscope reinsertion and showed       moderate improvement in luminal narrowing and no perforation.      A few erosions were found in the gastric antrum.      The exam of the stomach was otherwise normal.      The duodenal bulb and second portion of the duodenum were normal. Impression:               - Normal esophagus.                           - Mild changes of reflux esophagitis at GEJ.                           - Low-grade of narrowing Schatzki ring. Dilated.                           - Erosive gastropathy.                           -  Normal duodenal bulb and second portion of the                            duodenum.                           - No specimens collected. Moderate Sedation:      Per Anesthesia Care Recommendation:           - Patient has a contact number available for                            emergencies. The signs and symptoms of potential                            delayed complications were discussed with the                            patient. Return to normal activities tomorrow.                            Written discharge instructions were  provided to the                            patient.                           - Resume previous diet today.                           - Continue present medications.                           - Change PPI to Omeprazole to 20 mg po qam.                           - Perform an H. pylori serology today.                           - OV in 6 months. Procedure Code(s):        --- Professional ---                           [phone], Esophagogastroduodenoscopy, flexible,                            transoral; diagnostic, including collection of                            specimen(s) by brushing or washing, when performed                            (separate procedure)                           43450, Dilation of esophagus, by unguided sound or  bougie, single or multiple passes Diagnosis Code(s):        --- Professional ---                           K21.0, Gastro-esophageal reflux disease with                            esophagitis                           K22.2, Esophageal obstruction                           K31.89, Other diseases of stomach and duodenum                           R13.14, Dysphagia, pharyngoesophageal phase CPT copyright 2016 American Medical Association. All rights reserved. The codes documented in this report are preliminary and upon coder review may  be revised to meet current compliance requirements. Lionel December, MD Lionel December, MD 08/23/2017 10:48:08 AM This report has been signed electronically. Number of Addenda: 0

## 2017-08-23 NOTE — Discharge Instructions (Signed)
Discontinue pantoprazole and begin omeprazole 20 mg by mouth 30 minutes before breakfast daily. Resume other medications as before. High-fiber diet. Physician will call with results of blood test. No driving for 24 hours. Office visit in 6 months.  Colonoscopy, Adult, Care After This sheet gives you information about how to care for yourself after your procedure. Your health care provider may also give you more specific instructions. If you have problems or questions, contact your health care provider. What can I expect after the procedure? After the procedure, it is common to have:  A small amount of blood in your stool for 24 hours after the procedure.  Some gas.  Mild abdominal cramping or bloating.  Follow these instructions at home: General instructions   For the first 24 hours after the procedure: ? Do not drive or use machinery. ? Do not sign important documents. ? Do not drink alcohol. ? Do your regular daily activities at a slower pace than normal. ? Eat soft, easy-to-digest foods. ? Rest often.  Take over-the-counter or prescription medicines only as told by your health care provider.  It is up to you to get the results of your procedure. Ask your health care provider, or the department performing the procedure, when your results will be ready. Relieving cramping and bloating  Try walking around when you have cramps or feel bloated.  Apply heat to your abdomen as told by your health care provider. Use a heat source that your health care provider recommends, such as a moist heat pack or a heating pad. ? Place a towel between your skin and the heat source. ? Leave the heat on for 20-30 minutes. ? Remove the heat if your skin turns bright red. This is especially important if you are unable to feel pain, heat, or cold. You may have a greater risk of getting burned. Eating and drinking  Drink enough fluid to keep your urine clear or pale yellow.  Resume your normal  diet as instructed by your health care provider. Avoid heavy or fried foods that are hard to digest.  Avoid drinking alcohol for as long as instructed by your health care provider. Contact a health care provider if:  You have blood in your stool 2-3 days after the procedure. Get help right away if:  You have more than a small spotting of blood in your stool.  You pass large blood clots in your stool.  Your abdomen is swollen.  You have nausea or vomiting.  You have a fever.  You have increasing abdominal pain that is not relieved with medicine. This information is not intended to replace advice given to you by your health care provider. Make sure you discuss any questions you have with your health care provider. Document Released: 01/24/2004 Document Revised: 03/05/2016 Document Reviewed: 08/23/2015 Elsevier Interactive Patient Education  2018 [employer]  Esophagogastroduodenoscopy, Care After Refer to this sheet in the next few weeks. These instructions provide you with information about caring for yourself after your procedure. Your health care provider may also give you more specific instructions. Your treatment has been planned according to current medical practices, but problems sometimes occur. Call your health care provider if you have any problems or questions after your procedure. What can I expect after the procedure? After the procedure, it is common to have:  A sore throat.  Nausea.  Bloating.  Dizziness.  Fatigue.  Follow these instructions at home:  Do not eat or drink anything until the numbing medicine (  local anesthetic) has worn off and your gag reflex has returned. You will know that the local anesthetic has worn off when you can swallow comfortably.  Do not drive for 24 hours if you received a medicine to help you relax (sedative).  If your health care provider took a tissue sample for testing during the procedure, make sure to get your test results.  This is your responsibility. Ask your health care provider or the department performing the test when your results will be ready.  Keep all follow-up visits as told by your health care provider. This is important. Contact a health care provider if:  You cannot stop coughing.  You are not urinating.  You are urinating less than usual. Get help right away if:  You have trouble swallowing.  You cannot eat or drink.  You have throat or chest pain that gets worse.  You are dizzy or light-headed.  You faint.  You have nausea or vomiting.  You have chills.  You have a fever.  You have severe abdominal pain.  You have black, tarry, or bloody stools. This information is not intended to replace advice given to you by your health care provider. Make sure you discuss any questions you have with your health care provider. Document Released: 05/28/2012 Document Revised: 11/17/2015 Document Reviewed: 05/05/2015 Elsevier Interactive Patient Education  [employer].

## 2017-08-26 LAB — H. PYLORI ANTIBODY, IGG: H Pylori IgG: <0.8 Index Value (ref 0.00–0.79)

## 2017-08-27 ENCOUNTER — Other Ambulatory Visit: Payer: Self-pay | Admitting: Family Medicine

## 2017-08-28 NOTE — Telephone Encounter (Signed)
Ok plus 3 monthly ref 

## 2017-09-03 ENCOUNTER — Encounter: Payer: BLUE CROSS/BLUE SHIELD | Admitting: Family Medicine

## 2017-09-05 ENCOUNTER — Ambulatory Visit (INDEPENDENT_AMBULATORY_CARE_PROVIDER_SITE_OTHER): Payer: BLUE CROSS/BLUE SHIELD | Admitting: Family Medicine

## 2017-09-05 ENCOUNTER — Encounter: Payer: Self-pay | Admitting: Family Medicine

## 2017-09-05 VITALS — BP 126/88 | Ht 70.5 in | Wt 195.0 lb

## 2017-09-05 DIAGNOSIS — Z0000 Encounter for general adult medical examination without abnormal findings: Secondary | ICD-10-CM

## 2017-09-05 MED ORDER — DIAZEPAM 5 MG PO TABS: ORAL_TABLET | ORAL | 5 refills | Status: DC

## 2017-09-05 NOTE — Progress Notes (Signed)
   Subjective:    Patient ID: Brad Rosales, male    DOB: 03/21/1961, 57 y.o.   MRN: [id number]  HPI The patient comes in today for a wellness visit.    A review of their health history was completed.  A review of medications was also completed.  Any needed refills; none  Eating habits: health conscious  Falls/  MVA accidents in past few months: none  Regular exercise: none  Specialist pt sees on regular basis: none  Preventative health issues were discussed.   Additional concerns: none - needs biometric screening form filled out.   Sticking eith thyr faithfully  Gets a fair amnt with work    Using vinear supplenment ot help ,  Trying to ea healthy   Compliant with thyroid medication.  Compliant with generic diazepam.     Review of Systems  Constitutional: Negative for activity change, appetite change and fever.  HENT: Negative for congestion and rhinorrhea.   Eyes: Negative for discharge.  Respiratory: Negative for cough and wheezing.   Cardiovascular: Negative for chest pain.  Gastrointestinal: Negative for abdominal pain, blood in stool and vomiting.  Genitourinary: Negative for difficulty urinating and frequency.  Musculoskeletal: Negative for neck pain.  Skin: Negative for rash.  Allergic/Immunologic: Negative for environmental allergies and food allergies.  Neurological: Negative for weakness and headaches.  Psychiatric/Behavioral: Negative for agitation.  All other systems reviewed and are negative.      Objective:   Physical Exam  Constitutional: He appears well-developed and well-nourished.  HENT:  Head: Normocephalic and atraumatic.  Right Ear: External ear normal.  Left Ear: External ear normal.  Nose: Nose normal.  Mouth/Throat: Oropharynx is clear and moist.  Eyes: Right eye exhibits no discharge. Left eye exhibits no discharge. No scleral icterus.  Neck: Normal range of motion. Neck supple. No thyromegaly present.  Cardiovascular: Normal  rate, regular rhythm and normal heart sounds.  No murmur heard. Pulmonary/Chest: Effort normal and breath sounds normal. No respiratory distress. He has no wheezes.  Abdominal: Soft. Bowel sounds are normal. He exhibits no distension and no mass. There is no tenderness.  Genitourinary: Penis normal.  Musculoskeletal: Normal range of motion. He exhibits no edema.  Lymphadenopathy:    He has no cervical adenopathy.  Neurological: He is alert. He exhibits normal muscle tone. Coordination normal.  Skin: Skin is warm and dry. No erythema.  Psychiatric: He has a normal mood and affect. His behavior is normal. Judgment normal.  Vitals reviewed.         Assessment & Plan:  Impression wellness exam.  Diet discussed exercise discussed.  Up-to-date on colonoscopy  Hypothyroidism previous.  Compliance reviewed thyroid  3.  Insomnia ongoing challenges medication prescribed symptom care discussed

## 2017-09-05 NOTE — Patient Instructions (Signed)
Fiber Content in Foods See the following list for the dietary fiber content of some common foods. High-fiber foods High-fiber foods contain 4 grams or more (4g or more) of fiber per serving. They include:  Artichoke (fresh) - 1 medium has 10.3g of fiber.  Baked beans, plain or vegetarian (canned) -  cup has 5.2g of fiber.  Blackberries or raspberries (fresh) -  cup has 4g of fiber.  Bran cereal -  cup has 8.6g of fiber.  Bulgur (cooked) -  cup has 4g of fiber.  Kidney beans (canned) -  cup has 6.8g of fiber.  Lentils (cooked) -  cup has 7.8g of fiber.  Pear (fresh) - 1 medium has 5.1g of fiber.  Peas (frozen) -  cup has 4.4g of fiber.  Pinto beans (canned) -  cup has 5.5g of fiber.  Pinto beans (dried and cooked) -  cup has 7.7g of fiber.  Potato with skin (baked) - 1 medium has 4.4g of fiber.  Quinoa (cooked) -  cup has 5g of fiber.  Soybeans (canned, frozen, or fresh) -  cup has 5.1g of fiber.  Moderate-fiber foods Moderate-fiber foods contain 1-4 grams (1-4g) of fiber per serving. They include:  Almonds - 1 oz. has 3.5g of fiber.  Apple with skin - 1 medium has 3.3g of fiber.  Applesauce, sweetened -  cup has 1.5g of fiber.  Bagel, plain - one 4-inch (10-cm) bagel has 2g of fiber.  Banana - 1 medium has 3.1g of fiber.  Broccoli (cooked) -  cup has 2.5g of fiber.  Carrots (cooked) -  cup has 2.3g of fiber.  Corn (canned or frozen) -  cup has 2.1g of fiber.  Corn tortilla - one 6-inch (15-cm) tortilla has 1.5g of fiber.  Green beans (canned) -  cup has 2g of fiber.  Instant oatmeal -  cup has about 2g of fiber.  Long-grain brown rice (cooked) - 1 cup has 3.5g of fiber.  Macaroni, enriched (cooked) - 1 cup has 2.5g of fiber.  Melon - 1 cup has 1.4g of fiber.  Multigrain cereal -  cup has about 2-4g of fiber.  Orange - 1 small has 3.1g of fiber.  Potatoes, mashed -  cup has 1.6g of fiber.  Raisins - 1/4 cup has 1.6g of  fiber.  Squash -  cup has 2.9g of fiber.  Sunflower seeds -  cup has 1.1g of fiber.  Tomato - 1 medium has 1.5g of fiber.  Vegetable or soy patty - 1 has 3.4g of fiber.  Whole-wheat bread - 1 slice has 2g of fiber.  Whole-wheat spaghetti -  cup has 3.2g of fiber.  Low-fiber foods Low-fiber foods contain less than 1 gram (less than 1g) of fiber per serving. They include:  Egg - 1 large.  Flour tortilla - one 6-inch (15-cm) tortilla.  Fruit juice -  cup.  Lettuce - 1 cup.  Meat, poultry, or fish - 1 oz.  Milk - 1 cup.  Spinach (raw) - 1 cup.  White bread - 1 slice.  White rice -  cup.  Yogurt -  cup.  Actual amounts of fiber in foods may be different depending on processing. Talk with your dietitian about how much fiber you need in your diet. This information is not intended to replace advice given to you by your health care provider. Make sure you discuss any questions you have with your health care provider. Document Released: 10/28/2006 Document Revised: 11/17/2015 Document Reviewed: 08/04/2015 Elsevier Interactive Patient   Education  2018 Elsevier Inc.  

## 2017-09-09 ENCOUNTER — Encounter (HOSPITAL_COMMUNITY): Payer: Self-pay | Admitting: Internal Medicine

## 2018-01-28 ENCOUNTER — Ambulatory Visit (INDEPENDENT_AMBULATORY_CARE_PROVIDER_SITE_OTHER): Payer: BLUE CROSS/BLUE SHIELD | Admitting: Internal Medicine

## 2018-01-28 ENCOUNTER — Encounter (INDEPENDENT_AMBULATORY_CARE_PROVIDER_SITE_OTHER): Payer: Self-pay | Admitting: Internal Medicine

## 2018-03-11 ENCOUNTER — Ambulatory Visit (INDEPENDENT_AMBULATORY_CARE_PROVIDER_SITE_OTHER): Payer: BLUE CROSS/BLUE SHIELD | Admitting: Family Medicine

## 2018-03-11 ENCOUNTER — Encounter: Payer: Self-pay | Admitting: Family Medicine

## 2018-03-11 VITALS — BP 122/76 | Temp 98.4°F | Ht 70.5 in | Wt 201.8 lb

## 2018-03-11 DIAGNOSIS — J019 Acute sinusitis, unspecified: Secondary | ICD-10-CM | POA: Diagnosis not present

## 2018-03-11 MED ORDER — HYDROCODONE-HOMATROPINE 5-1.5 MG/5ML PO SYRP: ORAL_SOLUTION | ORAL | 0 refills | Status: DC

## 2018-03-11 MED ORDER — AMOXICILLIN-POT CLAVULANATE 875-125 MG PO TABS: ORAL_TABLET | ORAL | 0 refills | Status: DC

## 2018-03-11 NOTE — Progress Notes (Signed)
   Subjective:    Patient ID: Brad Rosales, male    DOB: 05/15/1961, 57 y.o.   MRN: [id number]  Cough  This is a new problem. The current episode started more than 1 month ago. Cough characteristics: was coughing up phelgm but nothing now. Associated symptoms include chest pain, rhinorrhea and a sore throat.   Cough , productive  gumky   Chest hurting and sharp pain transietn with cough  Not a smoker  Gets the fall allergies   Came on slowly over time  Needs some cough med at night        Review of Systems  HENT: Positive for rhinorrhea and sore throat.   Respiratory: Positive for cough.   Cardiovascular: Positive for chest pain.       Objective:   Physical Exam  Alert, mild malaise. Hydration good Vitals stable. frontal/ maxillary tenderness evident positive nasal congestion. pharynx normal neck supple  lungs clear/no crackles or wheezes. heart regular in rhythm       Assessment & Plan:  Impression rhinosinusitis/bronchitis likely post viral, discussed with patient. plan antibiotics prescribed. Questions answered. Symptomatic care discussed. warning signs discussed.  Nighttime Hycodan added.  If symptoms do not completely resolve return for further evaluation.  WSL

## 2018-04-09 ENCOUNTER — Encounter: Payer: Self-pay | Admitting: Family Medicine

## 2018-04-09 ENCOUNTER — Ambulatory Visit (INDEPENDENT_AMBULATORY_CARE_PROVIDER_SITE_OTHER): Payer: BLUE CROSS/BLUE SHIELD | Admitting: Family Medicine

## 2018-04-09 VITALS — BP 130/88 | Temp 98.1°F | Ht 70.5 in | Wt 201.0 lb

## 2018-04-09 DIAGNOSIS — J019 Acute sinusitis, unspecified: Secondary | ICD-10-CM | POA: Diagnosis not present

## 2018-04-09 MED ORDER — HYDROCODONE-HOMATROPINE 5-1.5 MG/5ML PO SYRP: ORAL_SOLUTION | ORAL | 0 refills | Status: DC

## 2018-04-09 MED ORDER — ALBUTEROL SULFATE HFA 108 (90 BASE) MCG/ACT IN AERS: 2.0000 | INHALATION_SPRAY | Freq: Four times a day (QID) | RESPIRATORY_TRACT | 0 refills | Status: DC | PRN

## 2018-04-09 MED ORDER — CLARITHROMYCIN 500 MG PO TABS: ORAL_TABLET | ORAL | 0 refills | Status: DC

## 2018-04-09 NOTE — Progress Notes (Signed)
   Subjective:    Patient ID: Brad Rosales, male    DOB: December 27, 1960, 57 y.o.   MRN: [id number]  Cough  This is a recurrent problem. The current episode started 1 to 4 weeks ago. The cough is non-productive.   Pt also has some spots on face and chest he would like checked out. Itches a little bit.  Had persistent cough and cong  Ongoing cough also some nasal congestion or drainage.  Several weeks duration.  Cough particularly bad at night    spot  Review of Systems  Respiratory: Positive for cough.        Objective:   Physical Exam  Alert, mild malaise. Hydration good Vitals stable. frontal/ maxillary tenderness evident positive nasal congestion. pharynx normal neck supple  lungs clear/no crackles or wheezes. heart regular in rhythm Several small discrete seborrheic keratoses/actinic keratoses noted      Assessment & Plan:  Impression rhinosinusitis/bronchitis likely post viral, discussed with patient. plan antibiotics prescribed. Questions answered. Symptomatic care discussed. warning signs discussed. WSL Hycodan nightly prescribed  Reassurance regarding skin lesion

## 2018-05-08 ENCOUNTER — Encounter: Payer: Self-pay | Admitting: Family Medicine

## 2018-05-08 ENCOUNTER — Ambulatory Visit (INDEPENDENT_AMBULATORY_CARE_PROVIDER_SITE_OTHER): Payer: BLUE CROSS/BLUE SHIELD | Admitting: Family Medicine

## 2018-05-08 VITALS — BP 112/80 | Ht 70.5 in | Wt 204.0 lb

## 2018-05-08 DIAGNOSIS — J019 Acute sinusitis, unspecified: Secondary | ICD-10-CM | POA: Diagnosis not present

## 2018-05-08 MED ORDER — TRAMADOL HCL 50 MG PO TABS: 50.0000 mg | ORAL_TABLET | Freq: Four times a day (QID) | ORAL | 0 refills | Status: DC | PRN

## 2018-05-08 MED ORDER — AZITHROMYCIN 250 MG PO TABS: ORAL_TABLET | ORAL | 0 refills | Status: DC

## 2018-05-08 MED ORDER — FLUTICASONE PROPIONATE 50 MCG/ACT NA SUSP: 2.0000 | Freq: Every day | NASAL | 5 refills | Status: DC

## 2018-05-08 NOTE — Progress Notes (Signed)
   Subjective:    Patient ID: Brad Rosales, male    DOB: 08/02/1960, 57 y.o.   MRN: [id number]  HPI Patient is here today due to a sinus infection.He says he has a cough, headache,sinus drainage,stopped up ongoing all summer. He says he has been on two rounds of antibx.    headache back part of the neck radiating to the front    Hx of pinched nerve in the   Cough and sinus cong and dranage an d gunkiness discharge  zpk prnn   mucinex rn cough  tyl pm for ba didscomfort     Patient also notes chronic neck pain times.  Worse with certain motion.   Review of Systems No headache, no major weight loss or weight gain, no chest pain no back pain abdominal pain no change in bowel habits complete ROS otherwise negative     Objective:   Physical Exam  Alert, mild malaise. Hydration good Vitals stable. frontal/ maxillary tenderness evident positive nasal congestion. pharynx normal neck supple  lungs clear/no crackles or wheezes. heart regular in rhythm       Assessment & Plan:  Impression rhinosinusitis likely post viral, discussed with patient. plan antibiotics prescribed. Questions answered. Symptomatic care discussed. warning signs discussed. WSL  Intermittent neck pain on not related to this respiratory sinus infection discussed

## 2018-06-09 ENCOUNTER — Other Ambulatory Visit: Payer: Self-pay | Admitting: Family Medicine

## 2018-06-09 ENCOUNTER — Telehealth: Payer: Self-pay | Admitting: Family Medicine

## 2018-06-09 DIAGNOSIS — Z125 Encounter for screening for malignant neoplasm of prostate: Secondary | ICD-10-CM

## 2018-06-09 DIAGNOSIS — Z13220 Encounter for screening for lipoid disorders: Secondary | ICD-10-CM

## 2018-06-09 DIAGNOSIS — Z79899 Other long term (current) drug therapy: Secondary | ICD-10-CM

## 2018-06-09 DIAGNOSIS — E038 Other specified hypothyroidism: Secondary | ICD-10-CM

## 2018-06-09 DIAGNOSIS — Z0000 Encounter for general adult medical examination without abnormal findings: Secondary | ICD-10-CM

## 2018-06-09 MED ORDER — LEVOTHYROXINE SODIUM 75 MCG PO TABS: 75.0000 ug | ORAL_TABLET | Freq: Every day | ORAL | 1 refills | Status: DC

## 2018-06-09 NOTE — Telephone Encounter (Signed)
Last labs were on 05/25/17 for TSH, Lipid, Hep, Bmet and PSA. Please advise. Thank you

## 2018-06-09 NOTE — Telephone Encounter (Signed)
PT REQUESTING A REFILL FOR levothyroxine (SYNTHROID, LEVOTHROID) 75 MCG tablet  Requesting 100 count   Pharmacy:  [facility] [address] - [place], [place] - [address] [place]  06/23/2018- 6 month follow up w/ Dr.Steve   Requesting blood work

## 2018-06-09 NOTE — Telephone Encounter (Signed)
Rep all same/       may rx 100 one ref

## 2018-06-09 NOTE — Telephone Encounter (Signed)
Labs orders placed and medication sent in. Pt is aware.

## 2018-06-16 DIAGNOSIS — X32XXXA Exposure to sunlight, initial encounter: Secondary | ICD-10-CM | POA: Diagnosis not present

## 2018-06-16 DIAGNOSIS — L821 Other seborrheic keratosis: Secondary | ICD-10-CM | POA: Diagnosis not present

## 2018-06-16 DIAGNOSIS — Z1283 Encounter for screening for malignant neoplasm of skin: Secondary | ICD-10-CM | POA: Diagnosis not present

## 2018-06-16 DIAGNOSIS — D225 Melanocytic nevi of trunk: Secondary | ICD-10-CM | POA: Diagnosis not present

## 2018-06-16 DIAGNOSIS — L570 Actinic keratosis: Secondary | ICD-10-CM | POA: Diagnosis not present

## 2018-06-16 DIAGNOSIS — B353 Tinea pedis: Secondary | ICD-10-CM | POA: Diagnosis not present

## 2018-06-23 ENCOUNTER — Ambulatory Visit (INDEPENDENT_AMBULATORY_CARE_PROVIDER_SITE_OTHER): Payer: BLUE CROSS/BLUE SHIELD | Admitting: Family Medicine

## 2018-06-23 ENCOUNTER — Encounter: Payer: Self-pay | Admitting: Family Medicine

## 2018-06-23 VITALS — BP 126/84 | Ht 70.5 in | Wt 204.2 lb

## 2018-06-23 DIAGNOSIS — F5101 Primary insomnia: Secondary | ICD-10-CM | POA: Diagnosis not present

## 2018-06-23 DIAGNOSIS — E038 Other specified hypothyroidism: Secondary | ICD-10-CM

## 2018-06-23 DIAGNOSIS — Z125 Encounter for screening for malignant neoplasm of prostate: Secondary | ICD-10-CM | POA: Diagnosis not present

## 2018-06-23 DIAGNOSIS — Z79899 Other long term (current) drug therapy: Secondary | ICD-10-CM | POA: Diagnosis not present

## 2018-06-23 DIAGNOSIS — Z0000 Encounter for general adult medical examination without abnormal findings: Secondary | ICD-10-CM | POA: Diagnosis not present

## 2018-06-23 NOTE — Progress Notes (Signed)
   Subjective:    Patient ID: Brad Rosales, male    DOB: 10/08/1960, 57 y.o.   MRN: [id number]  HPI  Patient arrives for a follow up on Hypothyroidism. Patient states he is having no problems or concerns and is doing well on current meds.  Results for orders placed or performed during the hospital encounter of 08/23/17  H. 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  Generally does not miss a dose.  Takes faithfully.  No obvious side effects.     Review of Systems No headache, no major weight loss or weight gain, no chest pain no back pain abdominal pain no change in bowel habits complete ROS otherwise negative     Objective:   Physical Exam Alert vitals stable, NAD. Blood pressure good on repeat. HEENT normal. Lungs clear. Heart regular rate and rhythm. Thyroid nonpalpable       Assessment & Plan:  Impression hypothyroidism.  Prior blood work reviewed.  Meds refilled.  Appropriate blood work ordered.  Further recommendations based on results.  Patient declines flu shot

## 2018-06-24 LAB — HEPATIC FUNCTION PANEL
ALT: 12 IU/L (ref 0–44)
AST: 15 IU/L (ref 0–40)
Albumin: 4.3 g/dL (ref 3.5–5.5)
Alkaline Phosphatase: 62 IU/L (ref 39–117)
Bilirubin Total: 0.5 mg/dL (ref 0.0–1.2)
Bilirubin, Direct: 0.08 mg/dL (ref 0.00–0.40)
Total Protein: 6.4 g/dL (ref 6.0–8.5)

## 2018-06-24 LAB — LIPID PANEL
Chol/HDL Ratio: 5.2 ratio — ABNORMAL HIGH (ref 0.0–5.0)
Cholesterol, Total: 196 mg/dL (ref 100–199)
HDL: 38 mg/dL — ABNORMAL LOW (ref >39)
LDL Calculated: 101 mg/dL — ABNORMAL HIGH (ref 0–99)
Triglycerides: 285 mg/dL — ABNORMAL HIGH (ref 0–149)
VLDL Cholesterol Cal: 57 mg/dL — ABNORMAL HIGH (ref 5–40)

## 2018-06-24 LAB — BASIC METABOLIC PANEL
BUN/Creatinine Ratio: 23 — ABNORMAL HIGH (ref 9–20)
BUN: 21 mg/dL (ref 6–24)
CO2: 23 mmol/L (ref 20–29)
Calcium: 9.1 mg/dL (ref 8.7–10.2)
Chloride: 106 mmol/L (ref 96–106)
Creatinine, Ser: 0.91 mg/dL (ref 0.76–1.27)
GFR calc Af Amer: 108 mL/min/{1.73_m2} (ref >59)
GFR calc non Af Amer: 93 mL/min/{1.73_m2} (ref >59)
Glucose: 88 mg/dL (ref 65–99)
Potassium: 5 mmol/L (ref 3.5–5.2)
Sodium: 141 mmol/L (ref 134–144)

## 2018-06-24 LAB — TSH: TSH: 1.89 u[IU]/mL (ref 0.450–4.500)

## 2018-06-24 LAB — PSA: Prostate Specific Ag, Serum: 0.5 ng/mL (ref 0.0–4.0)

## 2018-06-30 ENCOUNTER — Encounter: Payer: Self-pay | Admitting: Family Medicine

## 2018-07-08 ENCOUNTER — Ambulatory Visit (INDEPENDENT_AMBULATORY_CARE_PROVIDER_SITE_OTHER): Payer: BLUE CROSS/BLUE SHIELD | Admitting: Family Medicine

## 2018-07-08 ENCOUNTER — Encounter: Payer: Self-pay | Admitting: Family Medicine

## 2018-07-08 VITALS — BP 118/70 | Temp 98.2°F | Ht 70.5 in | Wt 204.0 lb

## 2018-07-08 DIAGNOSIS — G43019 Migraine without aura, intractable, without status migrainosus: Secondary | ICD-10-CM | POA: Diagnosis not present

## 2018-07-08 MED ORDER — ONDANSETRON 4 MG PO TBDP: 4.0000 mg | ORAL_TABLET | Freq: Three times a day (TID) | ORAL | 2 refills | Status: DC | PRN

## 2018-07-08 MED ORDER — TOPIRAMATE 25 MG PO TABS: ORAL_TABLET | ORAL | 0 refills | Status: DC

## 2018-07-08 MED ORDER — TOPIRAMATE 50 MG PO TABS: 50.0000 mg | ORAL_TABLET | Freq: Two times a day (BID) | ORAL | 2 refills | Status: DC

## 2018-07-08 NOTE — Patient Instructions (Signed)

## 2018-07-08 NOTE — Progress Notes (Signed)
   Subjective:    Patient ID: Brad Rosales, male    DOB: 10/26/1960, 58 y.o.   MRN: [id number] Patient arrives office for very protracted discussion regarding headaches Headache   This is a recurrent problem. The current episode started more than 1 year ago. Associated symptoms include blurred vision, dizziness, nausea, photophobia and sinus pressure.   Post headache, radiates to the font  Pos throbbing pain   Starts nuchal then toward the front   Pos photosensitiivty     Pos photophobia   Longstanding recent hx of migraines  Takes otc tylenol pm at night  Uses an ici pack  Goes away with sleep  The last week every day  Very painful at times   Gets nauseated a times with them even vomiting  Has used goody powdrs prn, and took b c, toda                               Review of Systems  HENT: Positive for sinus pressure.   Eyes: Positive for blurred vision and photophobia.  Gastrointestinal: Positive for nausea.  Neurological: Positive for dizziness and headaches.       Objective:   Physical Exam  Alert and oriented, vitals reviewed and stable, NAD ENT-TM's and ext canals WNL bilat via otoscopic exam Soft palate, tonsils and post pharynx WNL via oropharyngeal exam Neck-symmetric, no masses; thyroid nonpalpable and nontender Pulmonary-no tachypnea or accessory muscle use; Clear without wheezes via auscultation Card--no abnrml murmurs, rhythm reg and rate WNL Carotid pulses symmetric, without bruits No neck tenderness to palpation.  Neurological exam grossly intact funduscopic exam discs sharp.  Impression #1 probable worsening headaches.      Assessment & Plan:  #1 impression probable worsening migraine headaches.  Patient has had for many years.  More recently.  In fact substantially in recent weeks.  Multiple options discussed.  Multiple questions answered.  Patient would like to initiate a long-term preventive agent.  Topamax  discussed.  Proper use discussed.  Follow-up as scheduled.  Greater than 50% of this 25 minute face to face visit was spent in counseling and discussion and coordination of care regarding the above diagnosis/diagnosies

## 2018-07-09 ENCOUNTER — Telehealth: Payer: Self-pay | Admitting: Family Medicine

## 2018-07-09 DIAGNOSIS — R51 Headache: Principal | ICD-10-CM

## 2018-07-09 DIAGNOSIS — R519 Headache, unspecified: Secondary | ICD-10-CM

## 2018-07-09 NOTE — Telephone Encounter (Signed)
Pt's wife calling in requesting a referral for pts migraines. Pt was seen 07/08/18 and started the medication that day but is still currently having a migraine. She is hoping he will be able to be seen by specialist soon.

## 2018-07-09 NOTE — Telephone Encounter (Signed)
Please advise 

## 2018-07-09 NOTE — Telephone Encounter (Signed)
Neuro headache specialist referral

## 2018-07-09 NOTE — Telephone Encounter (Signed)
Referral made and pt is aware.

## 2018-07-10 ENCOUNTER — Telehealth: Payer: Self-pay | Admitting: *Deleted

## 2018-07-10 ENCOUNTER — Encounter: Payer: Self-pay | Admitting: Family Medicine

## 2018-07-10 ENCOUNTER — Ambulatory Visit (INDEPENDENT_AMBULATORY_CARE_PROVIDER_SITE_OTHER): Payer: BLUE CROSS/BLUE SHIELD | Admitting: Family Medicine

## 2018-07-10 ENCOUNTER — Telehealth: Payer: Self-pay

## 2018-07-10 VITALS — BP 118/76 | Temp 97.3°F | Ht 70.5 in | Wt 198.6 lb

## 2018-07-10 DIAGNOSIS — R51 Headache: Secondary | ICD-10-CM

## 2018-07-10 DIAGNOSIS — R519 Headache, unspecified: Secondary | ICD-10-CM

## 2018-07-10 DIAGNOSIS — R059 Cough, unspecified: Secondary | ICD-10-CM

## 2018-07-10 DIAGNOSIS — R05 Cough: Secondary | ICD-10-CM

## 2018-07-10 MED ORDER — CEFDINIR 300 MG PO CAPS: 300.0000 mg | ORAL_CAPSULE | Freq: Two times a day (BID) | ORAL | 0 refills | Status: DC

## 2018-07-10 MED ORDER — DIAZEPAM 5 MG PO TABS: ORAL_TABLET | ORAL | 0 refills | Status: DC

## 2018-07-10 NOTE — Telephone Encounter (Signed)
Patient was last seen in 2011 and called to make an appt to start his allergy injections again. He stated that he was getting his allergy injection at home and was wanting his wife who is a Charity fundraiserN give them to him. I informed patient that we do not allow patients to take vials home to receive injections. Patient stated the he had been getting them at home and due to his work schedule is hard for him to get in and out of the office. I informed patient that unfortunately we do not allow this and he would have to do his best with getting to the office. Patient also asked how much it would cost and I informed him that it depends on his insurance. Patient stated that he cant come into the office all the time to get my allergy injections and if he cant take them home then he don't even need to schedule an appt and hung up.

## 2018-07-10 NOTE — Progress Notes (Signed)
   Subjective:    Patient ID: Brad Rosales, male    DOB: 10-29-1960, 58 y.o.   MRN: [id number]  Headache   This is a new problem. Episode onset: off and on for a few months but daily for the past week. Associated symptoms comments: Gets hot and sweaty, cough, sinus drainage, pressure behind eyes, pressure in ears, dizziness, blurry vision. Treatments tried: topamax, zofran, tramadol.   Having cough and cong and drange   Uses valium five mg, normallu uses for back pain and nerves  ,uses just as need   The lateral headahe has nw moved to the whle head hurting  When outside in the cold air, headaches get triggered  Congestion an dranage signiicant  Still having cough and aching form it   Took two tye pm last night    Ear discomfort   No fever or muscle aches   But feeling a bit sweating and feeling not the best  Patient states that headache at times this week is been so severe he consider going to the hospital.  No thunderclap headache per history.  Positive family and personal history of migraines.  Patient also wonders about sinus element.  When patient wakes up first thing in the morning still has headache.  Also with cough and sneezing notes increase of the headache.  Review of Systems  Neurological: Positive for headaches.       Objective:   Physical Exam  Alert and oriented, vitals reviewed and stable, NAD ENT-TM's and ext canals WNL bilat via otoscopic exam Soft palate, tonsils and post pharynx WNL via oropharyngeal exam Neck-symmetric, no masses; thyroid nonpalpable and nontender Pulmonary-no tachypnea or accessory muscle use; Clear without wheezes via auscultation Card--no abnrml murmurs, rhythm reg and rate WNL Carotid pulses symmetric, without bruits Neck supple.  Some nasal congestion.  No focal neurological deficits.      Assessment & Plan:  Impression worsening headaches now developing some red flags in description.  Still think progression on  migraine intervention is a good idea.  However with additional symptomatology will strongly recommend we press on with MRI.  Also neurology referral.  Also will cover with antibiotics for potential sinusitis only.  Discussed at length  Patient also notes chronic cough off and on for months.  We will press on do chest x-ray 2.  Greater than 50% of this 25 minute face to face visit was spent in counseling and discussion and coordination of care regarding the above diagnosis/diagnosies

## 2018-07-10 NOTE — Telephone Encounter (Signed)
After putting in order for MRI pt wanted me to call his wife Reginold Agent with the appt info. Phone number [phone]. I was unable to edit referral to put this info in. Pt advised to put wife on his release of info if not already done.

## 2018-07-11 NOTE — Telephone Encounter (Signed)
Left detailed message on voicemail to wife's phone number  Appointment for MRI is Thursday 07/17/2018, arrive 1:30pm to [facility] radiology

## 2018-07-17 ENCOUNTER — Ambulatory Visit (HOSPITAL_COMMUNITY)
Admission: RE | Admit: 2018-07-17 | Discharge: 2018-07-17 | Disposition: A | Discharge status: Home / Self Care | Payer: BLUE CROSS/BLUE SHIELD | Source: Ambulatory Visit | Attending: Family Medicine | Admitting: Family Medicine

## 2018-07-17 DIAGNOSIS — R05 Cough: Secondary | ICD-10-CM | POA: Diagnosis not present

## 2018-07-17 DIAGNOSIS — R42 Dizziness and giddiness: Secondary | ICD-10-CM | POA: Diagnosis not present

## 2018-07-17 DIAGNOSIS — R51 Headache: Secondary | ICD-10-CM | POA: Insufficient documentation

## 2018-07-17 DIAGNOSIS — R519 Headache, unspecified: Secondary | ICD-10-CM

## 2018-07-18 ENCOUNTER — Encounter: Payer: Self-pay | Admitting: Family Medicine

## 2018-07-18 ENCOUNTER — Other Ambulatory Visit: Payer: Self-pay | Admitting: *Deleted

## 2018-07-18 DIAGNOSIS — R0981 Nasal congestion: Secondary | ICD-10-CM

## 2018-07-23 ENCOUNTER — Encounter: Payer: Self-pay | Admitting: Family Medicine

## 2018-08-28 ENCOUNTER — Ambulatory Visit (INDEPENDENT_AMBULATORY_CARE_PROVIDER_SITE_OTHER): Payer: BLUE CROSS/BLUE SHIELD | Admitting: Otolaryngology

## 2018-08-28 DIAGNOSIS — R51 Headache: Secondary | ICD-10-CM

## 2018-08-28 DIAGNOSIS — J310 Chronic rhinitis: Secondary | ICD-10-CM

## 2018-08-28 DIAGNOSIS — J343 Hypertrophy of nasal turbinates: Secondary | ICD-10-CM | POA: Diagnosis not present

## 2018-08-28 DIAGNOSIS — J342 Deviated nasal septum: Secondary | ICD-10-CM | POA: Diagnosis not present

## 2018-08-29 ENCOUNTER — Telehealth: Payer: Self-pay | Admitting: Family Medicine

## 2018-08-29 NOTE — Telephone Encounter (Signed)
Patient notified

## 2018-08-29 NOTE — Telephone Encounter (Signed)
None needed

## 2018-08-29 NOTE — Telephone Encounter (Signed)
Last labs 06/23/18: Lipid, Liver, Met 7, PSA, TSH

## 2018-08-29 NOTE — Telephone Encounter (Signed)
Pt has physical set for 09/08/2018. Does pt need to get lab work done before appt.

## 2018-09-03 ENCOUNTER — Other Ambulatory Visit: Payer: Self-pay

## 2018-09-03 ENCOUNTER — Ambulatory Visit (INDEPENDENT_AMBULATORY_CARE_PROVIDER_SITE_OTHER): Payer: BLUE CROSS/BLUE SHIELD | Admitting: Family Medicine

## 2018-09-03 ENCOUNTER — Encounter: Payer: Self-pay | Admitting: Family Medicine

## 2018-09-03 ENCOUNTER — Ambulatory Visit: Payer: BLUE CROSS/BLUE SHIELD | Admitting: Family Medicine

## 2018-09-03 VITALS — BP 112/70 | Temp 98.0°F | Ht 70.5 in | Wt 196.1 lb

## 2018-09-03 DIAGNOSIS — J310 Chronic rhinitis: Secondary | ICD-10-CM

## 2018-09-03 DIAGNOSIS — J329 Chronic sinusitis, unspecified: Secondary | ICD-10-CM | POA: Diagnosis not present

## 2018-09-03 MED ORDER — HYDROCODONE-HOMATROPINE 5-1.5 MG/5ML PO SYRP: ORAL_SOLUTION | ORAL | 0 refills | Status: DC

## 2018-09-03 MED ORDER — CEFPROZIL 500 MG PO TABS: 500.0000 mg | ORAL_TABLET | Freq: Two times a day (BID) | ORAL | 0 refills | Status: AC

## 2018-09-03 NOTE — Progress Notes (Signed)
   Subjective:    Patient ID: Brad Rosales, male    DOB: 07-06-1960, 58 y.o.   MRN: [id number]  HPI Patient is here today with complaints of a cough, runny nose,sore throat, no fever.  Symptoms on going for the last two days.  He states he is taking metamucil and nyquil.  Just the last days   Pos prod cough   Sig challenges with cough      Review of Systems No headache, no major weight loss or weight gain, no chest pain no back pain abdominal pain no change in bowel habits complete ROS otherwise negative     Objective:   Physical Exam Alert, mild malaise. Hydration good Vitals stable. frontal/ maxillary tenderness evident positive nasal congestion. pharynx normal neck supple  lungs clear/no crackles or wheezes. heart regular in rhythm        Assessment & Plan:  Impression rhinosinusitis likely post viral, discussed with patient. plan antibiotics prescribed. Questions answered. Symptomatic care discussed. warning signs discussed. WSL

## 2018-09-04 ENCOUNTER — Other Ambulatory Visit: Payer: Self-pay | Admitting: Family Medicine

## 2018-09-04 ENCOUNTER — Telehealth: Payer: Self-pay | Admitting: Family Medicine

## 2018-09-04 MED ORDER — HYDROCODONE-HOMATROPINE 5-1.5 MG/5ML PO SYRP: ORAL_SOLUTION | ORAL | 0 refills | Status: DC

## 2018-09-04 NOTE — Telephone Encounter (Signed)
Medicine was sent as requested

## 2018-09-04 NOTE — Telephone Encounter (Signed)
Requesting refill of HYDROcodone-homatropine (HYCODAN) 5-1.5 MG/5ML syrup to be sent to another pharmacy due to not having in stock at [facility]. ° ° °[facility]- [address], [place] [address] ° °

## 2018-09-04 NOTE — Telephone Encounter (Signed)
Was sent to [facility] yesterday but is on back order and not avaiable at [facility] sent in to [facility] [place] 

## 2018-09-04 NOTE — Telephone Encounter (Signed)
Pt.notified

## 2018-09-08 ENCOUNTER — Other Ambulatory Visit: Payer: Self-pay

## 2018-09-08 ENCOUNTER — Ambulatory Visit (INDEPENDENT_AMBULATORY_CARE_PROVIDER_SITE_OTHER): Payer: BLUE CROSS/BLUE SHIELD | Admitting: Family Medicine

## 2018-09-08 ENCOUNTER — Encounter: Payer: Self-pay | Admitting: Family Medicine

## 2018-09-08 VITALS — BP 120/82 | Ht 70.5 in | Wt 197.0 lb

## 2018-09-08 DIAGNOSIS — Z0000 Encounter for general adult medical examination without abnormal findings: Secondary | ICD-10-CM | POA: Diagnosis not present

## 2018-09-08 MED ORDER — LEVOTHYROXINE SODIUM 75 MCG PO TABS: 75.0000 ug | ORAL_TABLET | Freq: Every day | ORAL | 1 refills | Status: DC

## 2018-09-08 MED ORDER — HYDROCODONE-HOMATROPINE 5-1.5 MG/5ML PO SYRP: ORAL_SOLUTION | ORAL | 0 refills | Status: DC

## 2018-09-08 MED ORDER — DIAZEPAM 5 MG PO TABS: ORAL_TABLET | ORAL | 5 refills | Status: DC

## 2018-09-08 MED ORDER — FLUTICASONE PROPIONATE 50 MCG/ACT NA SUSP: 2.0000 | Freq: Every day | NASAL | 5 refills | Status: DC

## 2018-09-08 NOTE — Progress Notes (Signed)
Subjective:    Patient ID: Brad Rosales, male    DOB: 02-19-1961, 58 y.o.   MRN: [id number]  HPI  The patient comes in today for a wellness visit.    A review of their health history was completed.  A review of medications was also completed.  Any needed refills; States he still has a cough and wants a refill on the cough medication.  Eating habits: Normal  Falls/  MVA accidents in past few months: No  Regular exercise: Yes  Specialist pt sees on regular basis: No  Preventative health issues were discussed.   Additional concerns: No  Results for orders placed or performed in visit on 06/09/18  PSA  Result Value Ref Range   Prostate Specific Ag, Serum 0.5 0.0 - 4.0 ng/mL  Basic Metabolic Panel (BMET)  Result Value Ref Range   Glucose 88 65 - 99 mg/dL   BUN 21 6 - 24 mg/dL   Creatinine, Ser 8.33 0.76 - 1.27 mg/dL   GFR calc non Af Amer 93 >59 mL/min/1.73   GFR calc Af Amer 108 >59 mL/min/1.73   BUN/Creatinine Ratio 23 (H) 9 - 20   Sodium 141 134 - 144 mmol/L   Potassium 5.0 3.5 - 5.2 mmol/L   Chloride 106 96 - 106 mmol/L   CO2 23 20 - 29 mmol/L   Calcium 9.1 8.7 - 10.2 mg/dL  Hepatic function panel  Result Value Ref Range   Total Protein 6.4 6.0 - 8.5 g/dL   Albumin 4.3 3.5 - 5.5 g/dL   Bilirubin Total 0.5 0.0 - 1.2 mg/dL   Bilirubin, Direct 7.44 0.00 - 0.40 mg/dL   Alkaline Phosphatase 62 39 - 117 IU/L   AST 15 0 - 40 IU/L   ALT 12 0 - 44 IU/L  Lipid Profile  Result Value Ref Range   Cholesterol, Total 196 100 - 199 mg/dL   Triglycerides 514 (H) 0 - 149 mg/dL   HDL 38 (L) >60 mg/dL   VLDL Cholesterol Cal 57 (H) 5 - 40 mg/dL   LDL Calculated 479 (H) 0 - 99 mg/dL   Chol/HDL Ratio 5.2 (H) 0.0 - 5.0 ratio  TSH  Result Value Ref Range   TSH 1.890 0.450 - 4.500 uIU/mL    Coughing some  stil having some uup stuff  Needing ore cough meds   Review of Systems  Constitutional: Negative for activity change, appetite change and fever.  HENT: Negative for  congestion and rhinorrhea.   Eyes: Negative for discharge.  Respiratory: Negative for cough and wheezing.   Cardiovascular: Negative for chest pain.  Gastrointestinal: Negative for abdominal pain, blood in stool and vomiting.  Genitourinary: Negative for difficulty urinating and frequency.  Musculoskeletal: Negative for neck pain.  Skin: Negative for rash.  Allergic/Immunologic: Negative for environmental allergies and food allergies.  Neurological: Negative for weakness and headaches.  Psychiatric/Behavioral: Negative for agitation.  All other systems reviewed and are negative.      Objective:   Physical Exam Vitals signs reviewed.  Constitutional:      Appearance: He is well-developed.  HENT:     Head: Normocephalic and atraumatic.     Right Ear: External ear normal.     Left Ear: External ear normal.     Nose: Nose normal.  Eyes:     Pupils: Pupils are equal, round, and reactive to light.  Neck:     Musculoskeletal: Normal range of motion and neck supple.     Thyroid: No  thyromegaly.  Cardiovascular:     Rate and Rhythm: Normal rate and regular rhythm.     Heart sounds: Normal heart sounds. No murmur.  Pulmonary:     Effort: Pulmonary effort is normal. No respiratory distress.     Breath sounds: Normal breath sounds. No wheezing.  Abdominal:     General: Bowel sounds are normal. There is no distension.     Palpations: Abdomen is soft. There is no mass.     Tenderness: There is no abdominal tenderness.  Genitourinary:    Penis: Normal.   Musculoskeletal: Normal range of motion.  Lymphadenopathy:     Cervical: No cervical adenopathy.  Skin:    General: Skin is warm and dry.     Findings: No erythema.  Neurological:     Mental Status: He is alert.     Motor: No abnormal muscle tone.  Psychiatric:        Behavior: Behavior normal.        Judgment: Judgment normal.           Assessment & Plan:  Impression well adult exam.  Diet discussed exercise discussed  vaccinations discussed colon done 2019 next in 9 yrs.  Blood work discussed  2.  Chronic insomnia medication refill  3.  Persistent cough cough medicine refilled

## 2018-09-09 ENCOUNTER — Other Ambulatory Visit: Payer: Self-pay | Admitting: *Deleted

## 2018-09-09 ENCOUNTER — Telehealth: Payer: Self-pay | Admitting: Family Medicine

## 2018-09-09 MED ORDER — HYDROCODONE-HOMATROPINE 5-1.5 MG/5ML PO SYRP: ORAL_SOLUTION | ORAL | 0 refills | Status: DC

## 2018-09-09 NOTE — Telephone Encounter (Signed)
Signed pended order. PDMP database checked. Filled per Dr. Soyla Dryer note from yesterday's office visit.

## 2018-09-09 NOTE — Telephone Encounter (Signed)
Opened encounter couldn't find?

## 2018-09-09 NOTE — Telephone Encounter (Signed)
Patient was seen yesterday and prescription for hydrocodone cough syrup was called into [facility] and they are out needing it called into [facility] [address] [place]

## 2018-09-09 NOTE — Telephone Encounter (Signed)
Pt.notified

## 2018-09-09 NOTE — Telephone Encounter (Signed)
Med pended ready for signature

## 2018-12-18 ENCOUNTER — Telehealth: Payer: Self-pay | Admitting: Family Medicine

## 2018-12-18 NOTE — Telephone Encounter (Signed)
Sorry im not writing that for folks with sinus issues, he is putting others at risk, too

## 2018-12-18 NOTE — Telephone Encounter (Signed)
Pt states he needs a note excusing him from having to wear a mask in public now that [place] has made it mandatory to wear one. He states he has a lot of sinus issues and it prevents him from being able to wear one.   I did inform pt Dr. Richardson Landry is out of the office this week and he would address when he comes back in next week.

## 2018-12-19 NOTE — Telephone Encounter (Signed)
Patient notified and verbalized understanding. 

## 2019-03-25 ENCOUNTER — Other Ambulatory Visit: Payer: Self-pay | Admitting: Family Medicine

## 2019-03-25 NOTE — Telephone Encounter (Signed)
Six mo worth 

## 2019-04-03 ENCOUNTER — Other Ambulatory Visit: Payer: Self-pay

## 2019-04-03 ENCOUNTER — Encounter: Payer: Self-pay | Admitting: Nurse Practitioner

## 2019-04-03 ENCOUNTER — Ambulatory Visit (INDEPENDENT_AMBULATORY_CARE_PROVIDER_SITE_OTHER): Payer: BLUE CROSS/BLUE SHIELD | Admitting: Nurse Practitioner

## 2019-04-03 DIAGNOSIS — F411 Generalized anxiety disorder: Secondary | ICD-10-CM

## 2019-04-03 DIAGNOSIS — F430 Acute stress reaction: Secondary | ICD-10-CM

## 2019-04-03 MED ORDER — SERTRALINE HCL 50 MG PO TABS: ORAL_TABLET | ORAL | 0 refills | Status: DC

## 2019-04-03 NOTE — Progress Notes (Signed)
  PHONE VISIT Subjective:    Patient ID: Brad Rosales, male    DOB: 01-31-1961, 58 y.o.   MRN: [id number]  HPI Pt is having issues with stress. Pt is having dry heaves. Pt wife states that she has spoke with someone about Zoloft and would like for patient to try that. Wife states that Valium is not helping him.   Virtual Visit via Telephone Note  I connected with Tarance B Wixom on 04/03/19 at 11:20 AM EDT by telephone and verified that I am speaking with the correct person using two identifiers.  Location: Patient: home Provider: office   I discussed the limitations, risks, security and privacy concerns of performing an evaluation and management service by telephone and the availability of in person appointments. I also discussed with the patient that there may be a patient responsible charge related to this service. The patient expressed understanding and agreed to proceed.   History of Present Illness: Presents by phone with his wife for c/o anxiety, worse over the past week. Lost his father in April. His mother is in a nursing home and he cannot see her due to [place]. Is dealing with moving. Working overtime, sometimes more than 12 hours per day. Does not really worry about COVID or other issues. Taking his Valium at bedtime about everyday for sleep. Having early am awakenings. N/V and dry heaves at times over the past 3-4 days which he relates to stress. States his acid reflux has been under good control once he stopped taking several OTC vitamins. Denies suicidal or homicidal thoughts or ideation. Wife is suggesting Zoloft to help.  GAD 7 : Generalized Anxiety Score 04/03/2019  Nervous, Anxious, on Edge 3  Control/stop worrying 3  Worry too much - different things 3  Trouble relaxing 3  Restless 3  Easily annoyed or irritable 3  Afraid - awful might happen 3  Total GAD 7 Score 21  Anxiety Difficulty Somewhat difficult       Observations/Objective: Today's visit was via telephone  Physical exam was not possible for this visit   Assessment and Plan: Anxiety as acute reaction to exceptional stress  Meds ordered this encounter  Medications  . sertraline (ZOLOFT) 50 MG tablet    Sig: Take 1/2 tab po qam x 6 d then one po qam    Dispense:  30 tablet    Refill:  0    Order Specific Question:   Supervising Provider    Answer:   Sallee Lange A [[id number]]     Follow Up Instructions: Start Zoloft as directed. Reviewed potential adverse effects. DC med and contact office if any problems. Continue Valium for sleep. Return in about 1 month (around 05/04/2019) for Recheck.    I discussed the assessment and treatment plan with the patient. The patient was provided an opportunity to ask questions and all were answered. The patient agreed with the plan and demonstrated an understanding of the instructions.   The patient was advised to call back or seek an in-person evaluation if the symptoms worsen or if the condition fails to improve as anticipated.  I provided 15 minutes of non-face-to-face time during this encounter.       Review of Systems     Objective:   Physical Exam        Assessment & Plan:

## 2019-05-01 ENCOUNTER — Ambulatory Visit: Payer: BLUE CROSS/BLUE SHIELD | Admitting: Nurse Practitioner

## 2019-05-01 ENCOUNTER — Encounter: Payer: Self-pay | Admitting: Nurse Practitioner

## 2019-07-29 ENCOUNTER — Encounter: Payer: Self-pay | Admitting: Family Medicine

## 2019-08-30 ENCOUNTER — Other Ambulatory Visit: Payer: Self-pay | Admitting: Family Medicine

## 2019-08-31 NOTE — Telephone Encounter (Signed)
Pt scheduled 3/22 for CPE

## 2019-08-31 NOTE — Telephone Encounter (Signed)
Ok times one, rec b w and escheduled val this spring before finished (either complete pe or thyroid only (virtual or f to f)whichever pt wishes)  Needs lip liv m7 tsh psa

## 2019-08-31 NOTE — Telephone Encounter (Signed)
Please schedule and then route back to nurses 

## 2019-09-04 DIAGNOSIS — J343 Hypertrophy of nasal turbinates: Secondary | ICD-10-CM | POA: Diagnosis not present

## 2019-09-04 DIAGNOSIS — J310 Chronic rhinitis: Secondary | ICD-10-CM | POA: Diagnosis not present

## 2019-09-11 ENCOUNTER — Encounter (HOSPITAL_BASED_OUTPATIENT_CLINIC_OR_DEPARTMENT_OTHER): Payer: Self-pay | Admitting: Otolaryngology

## 2019-09-11 ENCOUNTER — Other Ambulatory Visit: Payer: Self-pay

## 2019-09-11 ENCOUNTER — Other Ambulatory Visit: Payer: Self-pay | Admitting: Otolaryngology

## 2019-09-14 ENCOUNTER — Other Ambulatory Visit: Payer: Self-pay

## 2019-09-14 ENCOUNTER — Encounter: Payer: Self-pay | Admitting: Family Medicine

## 2019-09-14 ENCOUNTER — Ambulatory Visit (INDEPENDENT_AMBULATORY_CARE_PROVIDER_SITE_OTHER): Payer: BLUE CROSS/BLUE SHIELD | Admitting: Family Medicine

## 2019-09-14 VITALS — BP 132/88 | Temp 96.5°F | Ht 72.0 in | Wt 192.8 lb

## 2019-09-14 DIAGNOSIS — Z0000 Encounter for general adult medical examination without abnormal findings: Secondary | ICD-10-CM

## 2019-09-14 DIAGNOSIS — Z13220 Encounter for screening for lipoid disorders: Secondary | ICD-10-CM

## 2019-09-14 DIAGNOSIS — J019 Acute sinusitis, unspecified: Secondary | ICD-10-CM | POA: Diagnosis not present

## 2019-09-14 DIAGNOSIS — Z79899 Other long term (current) drug therapy: Secondary | ICD-10-CM

## 2019-09-14 DIAGNOSIS — Z125 Encounter for screening for malignant neoplasm of prostate: Secondary | ICD-10-CM

## 2019-09-14 DIAGNOSIS — F5101 Primary insomnia: Secondary | ICD-10-CM

## 2019-09-14 MED ORDER — METHYLPREDNISOLONE ACETATE 40 MG/ML IJ SUSP
40.0000 mg | Freq: Once | INTRAMUSCULAR | Status: AC
  Administered 2019-09-14: 40 mg via INTRAMUSCULAR

## 2019-09-14 MED ORDER — LEVOTHYROXINE SODIUM 75 MCG PO TABS: 75.0000 ug | ORAL_TABLET | Freq: Every day | ORAL | 3 refills | Status: DC

## 2019-09-14 MED ORDER — DIAZEPAM 5 MG PO TABS: ORAL_TABLET | ORAL | 5 refills | Status: DC

## 2019-09-14 NOTE — Progress Notes (Signed)
Subjective:    Patient ID: Brad Rosales, male    DOB: September 12, 1960, 59 y.o.   MRN: [id number]  HPI The patient comes in today for a wellness visit.    A review of their health history was completed.  A review of medications was also completed.  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   Preventative health issues were discussed.   Additional concerns: pt would like allergy shot for pollen  Thyroid needs   Needs steroid shot for pollen and other reason      Review of Systems  Constitutional: Negative for activity change, appetite change and fever.  HENT: Negative for congestion and rhinorrhea.   Eyes: Negative for discharge.  Respiratory: Negative for cough and wheezing.   Cardiovascular: Negative for chest pain.  Gastrointestinal: Negative for abdominal pain, blood in stool and vomiting.  Genitourinary: Negative for difficulty urinating and frequency.  Musculoskeletal: Negative for neck pain.  Skin: Negative for rash.  Allergic/Immunologic: Negative for environmental allergies and food allergies.  Neurological: Negative for weakness and headaches.  Psychiatric/Behavioral: Negative for agitation.  All other systems reviewed and are negative.      Objective:   Physical Exam Vitals reviewed.  Constitutional:      Appearance: He is well-developed.  HENT:     Head: Normocephalic and atraumatic.     Right Ear: External ear normal.     Left Ear: External ear normal.     Nose: Nose normal.  Eyes:     Pupils: Pupils are equal, round, and reactive to light.  Neck:     Thyroid: No thyromegaly.  Cardiovascular:     Rate and Rhythm: Normal rate and regular rhythm.     Heart sounds: Normal heart sounds. No murmur.  Pulmonary:     Effort: Pulmonary effort is normal. No  respiratory distress.     Breath sounds: Normal breath sounds. No wheezing.  Abdominal:     General: Bowel sounds are normal. There is no distension.     Palpations: Abdomen is soft. There is no mass.     Tenderness: There is no abdominal tenderness.  Genitourinary:    Penis: Normal.   Musculoskeletal:        General: Normal range of motion.     Cervical back: Normal range of motion and neck supple.  Lymphadenopathy:     Cervical: No cervical adenopathy.  Skin:    General: Skin is warm and dry.     Findings: No erythema.  Neurological:     Mental Status: He is alert.     Motor: No abnormal muscle tone.  Psychiatric:        Behavior: Behavior normal.        Judgment: Judgment normal.           Assessment & Plan:  Impression well adult visit.  Diet discussed.  Exercise discussed and encouraged.  Patient needs blood work.  Patient has a form to fill out.  Await blood work in order to do this  2.  Hypothyroidism.  Status uncertain.  Patient compliant.  Medications refilled  3.  Chronic insomnia/uses nightly Valium as needed.  4.  Allergic rhinitis.  Fairly severe this spring.  Patient requests a steroid injection will give Depo-Medrol.  Up-to-date on colonoscopy.  Follow-up  in 6 months diet exercise discussed

## 2019-09-15 ENCOUNTER — Other Ambulatory Visit (HOSPITAL_COMMUNITY)
Admission: RE | Admit: 2019-09-15 | Discharge: 2019-09-15 | Disposition: A | Discharge status: Home / Self Care | Payer: BLUE CROSS/BLUE SHIELD | Source: Ambulatory Visit | Attending: Otolaryngology | Admitting: Otolaryngology

## 2019-09-15 DIAGNOSIS — Z20822 Contact with and (suspected) exposure to covid-19: Secondary | ICD-10-CM | POA: Insufficient documentation

## 2019-09-15 DIAGNOSIS — Z01812 Encounter for preprocedural laboratory examination: Secondary | ICD-10-CM | POA: Insufficient documentation

## 2019-09-15 LAB — BASIC METABOLIC PANEL
BUN/Creatinine Ratio: 19 (ref 9–20)
BUN: 18 mg/dL (ref 6–24)
CO2: 24 mmol/L (ref 20–29)
Calcium: 9.1 mg/dL (ref 8.7–10.2)
Chloride: 104 mmol/L (ref 96–106)
Creatinine, Ser: 0.94 mg/dL (ref 0.76–1.27)
GFR calc Af Amer: 102 mL/min/{1.73_m2} (ref >59)
GFR calc non Af Amer: 88 mL/min/{1.73_m2} (ref >59)
Glucose: 75 mg/dL (ref 65–99)
Potassium: 3.8 mmol/L (ref 3.5–5.2)
Sodium: 140 mmol/L (ref 134–144)

## 2019-09-15 LAB — HEPATIC FUNCTION PANEL
ALT: 11 IU/L (ref 0–44)
AST: 14 IU/L (ref 0–40)
Albumin: 4.3 g/dL (ref 3.8–4.9)
Alkaline Phosphatase: 65 IU/L (ref 39–117)
Bilirubin Total: 0.7 mg/dL (ref 0.0–1.2)
Bilirubin, Direct: 0.14 mg/dL (ref 0.00–0.40)
Total Protein: 6.4 g/dL (ref 6.0–8.5)

## 2019-09-15 LAB — LIPID PANEL
Chol/HDL Ratio: 4.4 ratio (ref 0.0–5.0)
Cholesterol, Total: 198 mg/dL (ref 100–199)
HDL: 45 mg/dL (ref >39)
LDL Chol Calc (NIH): 124 mg/dL — ABNORMAL HIGH (ref 0–99)
Triglycerides: 163 mg/dL — ABNORMAL HIGH (ref 0–149)
VLDL Cholesterol Cal: 29 mg/dL (ref 5–40)

## 2019-09-15 LAB — PSA: Prostate Specific Ag, Serum: 0.6 ng/mL (ref 0.0–4.0)

## 2019-09-15 LAB — SARS CORONAVIRUS 2 (TAT 6-24 HRS): SARS Coronavirus 2: NEGATIVE

## 2019-09-18 ENCOUNTER — Ambulatory Visit (HOSPITAL_BASED_OUTPATIENT_CLINIC_OR_DEPARTMENT_OTHER): Payer: BLUE CROSS/BLUE SHIELD | Admitting: Anesthesiology

## 2019-09-18 ENCOUNTER — Encounter (HOSPITAL_BASED_OUTPATIENT_CLINIC_OR_DEPARTMENT_OTHER): Payer: Self-pay | Admitting: Otolaryngology

## 2019-09-18 ENCOUNTER — Encounter (HOSPITAL_BASED_OUTPATIENT_CLINIC_OR_DEPARTMENT_OTHER)
Admission: RE | Disposition: A | Discharge status: Home / Self Care | Payer: Self-pay | Source: Home / Self Care | Attending: Otolaryngology

## 2019-09-18 ENCOUNTER — Ambulatory Visit (HOSPITAL_BASED_OUTPATIENT_CLINIC_OR_DEPARTMENT_OTHER)
Admission: RE | Admit: 2019-09-18 | Discharge: 2019-09-18 | Disposition: A | Discharge status: Home / Self Care | Payer: BLUE CROSS/BLUE SHIELD | Attending: Otolaryngology | Admitting: Otolaryngology

## 2019-09-18 ENCOUNTER — Other Ambulatory Visit: Payer: Self-pay

## 2019-09-18 DIAGNOSIS — F5104 Psychophysiologic insomnia: Secondary | ICD-10-CM | POA: Diagnosis not present

## 2019-09-18 DIAGNOSIS — G4700 Insomnia, unspecified: Secondary | ICD-10-CM | POA: Diagnosis not present

## 2019-09-18 DIAGNOSIS — F419 Anxiety disorder, unspecified: Secondary | ICD-10-CM | POA: Diagnosis not present

## 2019-09-18 DIAGNOSIS — J309 Allergic rhinitis, unspecified: Secondary | ICD-10-CM | POA: Diagnosis not present

## 2019-09-18 DIAGNOSIS — E039 Hypothyroidism, unspecified: Secondary | ICD-10-CM | POA: Insufficient documentation

## 2019-09-18 DIAGNOSIS — J3489 Other specified disorders of nose and nasal sinuses: Secondary | ICD-10-CM | POA: Insufficient documentation

## 2019-09-18 DIAGNOSIS — J343 Hypertrophy of nasal turbinates: Secondary | ICD-10-CM | POA: Insufficient documentation

## 2019-09-18 HISTORY — DX: Hypothyroidism, unspecified: E03.9

## 2019-09-18 HISTORY — DX: Hypertrophy of nasal turbinates: J34.3

## 2019-09-18 HISTORY — PX: TURBINATE REDUCTION: SHX6157

## 2019-09-18 HISTORY — DX: Anxiety disorder, unspecified: F41.9

## 2019-09-18 SURGERY — REDUCTION, NASAL TURBINATE
Anesthesia: General | Site: Nose | Laterality: Bilateral

## 2019-09-18 MED ORDER — PROMETHAZINE HCL 25 MG/ML IJ SOLN: 6.2500 mg | INTRAMUSCULAR | Status: DC | PRN

## 2019-09-18 MED ORDER — BACITRACIN ZINC 500 UNIT/GM EX OINT
TOPICAL_OINTMENT | CUTANEOUS | Status: AC
  Filled 2019-09-18: qty 1.8

## 2019-09-18 MED ORDER — DEXAMETHASONE SODIUM PHOSPHATE 4 MG/ML IJ SOLN
INTRAMUSCULAR | Status: DC | PRN
  Administered 2019-09-18: 10 mg via INTRAVENOUS

## 2019-09-18 MED ORDER — FENTANYL CITRATE (PF) 100 MCG/2ML IJ SOLN
INTRAMUSCULAR | Status: AC
  Filled 2019-09-18: qty 2

## 2019-09-18 MED ORDER — FENTANYL CITRATE (PF) 100 MCG/2ML IJ SOLN
INTRAMUSCULAR | Status: DC | PRN
  Administered 2019-09-18: 100 ug via INTRAVENOUS

## 2019-09-18 MED ORDER — ROCURONIUM 10MG/ML (10ML) SYRINGE FOR MEDFUSION PUMP - OPTIME
INTRAVENOUS | Status: DC | PRN
  Administered 2019-09-18: 50 mg via INTRAVENOUS

## 2019-09-18 MED ORDER — OXYCODONE HCL 5 MG/5ML PO SOLN: 5.0000 mg | Freq: Once | ORAL | Status: DC | PRN

## 2019-09-18 MED ORDER — OXYMETAZOLINE HCL 0.05 % NA SOLN
NASAL | Status: DC | PRN
  Administered 2019-09-18: 1 via TOPICAL

## 2019-09-18 MED ORDER — OXYCODONE HCL 5 MG PO TABS: 5.0000 mg | ORAL_TABLET | Freq: Once | ORAL | Status: DC | PRN

## 2019-09-18 MED ORDER — LIDOCAINE 2% (20 MG/ML) 5 ML SYRINGE
INTRAMUSCULAR | Status: AC
  Filled 2019-09-18: qty 5

## 2019-09-18 MED ORDER — AMOXICILLIN 875 MG PO TABS: 875.0000 mg | ORAL_TABLET | Freq: Two times a day (BID) | ORAL | 0 refills | Status: AC

## 2019-09-18 MED ORDER — MIDAZOLAM HCL 2 MG/2ML IJ SOLN
INTRAMUSCULAR | Status: AC
  Filled 2019-09-18: qty 2

## 2019-09-18 MED ORDER — HYDROMORPHONE HCL 1 MG/ML IJ SOLN
INTRAMUSCULAR | Status: AC
  Filled 2019-09-18: qty 0.5

## 2019-09-18 MED ORDER — LIDOCAINE 2% (20 MG/ML) 5 ML SYRINGE
INTRAMUSCULAR | Status: DC | PRN
  Administered 2019-09-18: 60 mg via INTRAVENOUS

## 2019-09-18 MED ORDER — EPHEDRINE 5 MG/ML INJ
INTRAVENOUS | Status: AC
  Filled 2019-09-18: qty 10

## 2019-09-18 MED ORDER — CEFAZOLIN SODIUM 1 G IJ SOLR
INTRAMUSCULAR | Status: AC
  Filled 2019-09-18: qty 20

## 2019-09-18 MED ORDER — HYDROMORPHONE HCL 1 MG/ML IJ SOLN
0.2500 mg | INTRAMUSCULAR | Status: DC | PRN
  Administered 2019-09-18: 0.5 mg via INTRAVENOUS

## 2019-09-18 MED ORDER — SUGAMMADEX SODIUM 200 MG/2ML IV SOLN
INTRAVENOUS | Status: DC | PRN
  Administered 2019-09-18: 200 mg via INTRAVENOUS

## 2019-09-18 MED ORDER — CEFAZOLIN SODIUM-DEXTROSE 2-3 GM-%(50ML) IV SOLR
INTRAVENOUS | Status: DC | PRN
  Administered 2019-09-18: 2 g via INTRAVENOUS

## 2019-09-18 MED ORDER — KETOROLAC TROMETHAMINE 30 MG/ML IJ SOLN: 30.0000 mg | Freq: Once | INTRAMUSCULAR | Status: DC | PRN

## 2019-09-18 MED ORDER — DEXAMETHASONE SODIUM PHOSPHATE 10 MG/ML IJ SOLN
INTRAMUSCULAR | Status: AC
  Filled 2019-09-18: qty 1

## 2019-09-18 MED ORDER — MEPERIDINE HCL 25 MG/ML IJ SOLN: 6.2500 mg | INTRAMUSCULAR | Status: DC | PRN

## 2019-09-18 MED ORDER — ONDANSETRON HCL 4 MG/2ML IJ SOLN
INTRAMUSCULAR | Status: DC | PRN
  Administered 2019-09-18: 4 mg via INTRAVENOUS

## 2019-09-18 MED ORDER — OXYCODONE-ACETAMINOPHEN 5-325 MG PO TABS: 1.0000 | ORAL_TABLET | ORAL | 0 refills | Status: AC | PRN

## 2019-09-18 MED ORDER — MIDAZOLAM HCL 2 MG/2ML IJ SOLN: 1.0000 mg | INTRAMUSCULAR | Status: DC | PRN

## 2019-09-18 MED ORDER — LACTATED RINGERS IV SOLN: INTRAVENOUS | Status: DC

## 2019-09-18 MED ORDER — ROCURONIUM BROMIDE 10 MG/ML (PF) SYRINGE
PREFILLED_SYRINGE | INTRAVENOUS | Status: AC
  Filled 2019-09-18: qty 10

## 2019-09-18 MED ORDER — PROPOFOL 10 MG/ML IV BOLUS
INTRAVENOUS | Status: DC | PRN
  Administered 2019-09-18: 150 mg via INTRAVENOUS

## 2019-09-18 MED ORDER — ONDANSETRON HCL 4 MG/2ML IJ SOLN
INTRAMUSCULAR | Status: AC
  Filled 2019-09-18: qty 2

## 2019-09-18 MED ORDER — FENTANYL CITRATE (PF) 100 MCG/2ML IJ SOLN: 50.0000 ug | INTRAMUSCULAR | Status: DC | PRN

## 2019-09-18 SURGICAL SUPPLY — 27 items
ATTRACTOMAT 16X20 MAGNETIC DRP (DRAPES) IMPLANT
CANISTER SUCT 1200ML W/VALVE (MISCELLANEOUS) ×2 IMPLANT
COAGULATOR SUCT 8FR VV (MISCELLANEOUS) ×1 IMPLANT
COVER WAND RF STERILE (DRAPES) IMPLANT
DECANTER SPIKE VIAL GLASS SM (MISCELLANEOUS) IMPLANT
ELECT REM PT RETURN 9FT ADLT (ELECTROSURGICAL) ×2
ELECTRODE REM PT RTRN 9FT ADLT (ELECTROSURGICAL) ×1 IMPLANT
GLOVE BIO SURGEON STRL SZ7.5 (GLOVE) ×2 IMPLANT
GLOVE BIOGEL PI IND STRL 6.5 (GLOVE) IMPLANT
GLOVE BIOGEL PI IND STRL 7.0 (GLOVE) IMPLANT
GLOVE BIOGEL PI INDICATOR 6.5 (GLOVE) ×1
GLOVE BIOGEL PI INDICATOR 7.0 (GLOVE) ×1
GLOVE SURG SS PI 6.5 STRL IVOR (GLOVE) ×2 IMPLANT
GOWN STRL REUS W/ TWL LRG LVL3 (GOWN DISPOSABLE) ×2 IMPLANT
GOWN STRL REUS W/TWL LRG LVL3 (GOWN DISPOSABLE) ×6
NDL HYPO 25X1 1.5 SAFETY (NEEDLE) IMPLANT
NEEDLE HYPO 25X1 1.5 SAFETY (NEEDLE) IMPLANT
NS IRRIG 1000ML POUR BTL (IV SOLUTION) ×2 IMPLANT
PACK BASIN DAY SURGERY FS (CUSTOM PROCEDURE TRAY) ×2 IMPLANT
PACK ENT DAY SURGERY (CUSTOM PROCEDURE TRAY) ×2 IMPLANT
PATTIES SURGICAL .5 X3 (DISPOSABLE) IMPLANT
SLEEVE SCD COMPRESS KNEE MED (MISCELLANEOUS) ×1 IMPLANT
SOLUTION BUTLER CLEAR DIP (MISCELLANEOUS) ×2 IMPLANT
SPONGE GAUZE 2X2 8PLY STRL LF (GAUZE/BANDAGES/DRESSINGS) ×2 IMPLANT
SPONGE NEURO XRAY DETECT 1X3 (DISPOSABLE) ×2 IMPLANT
TOWEL GREEN STERILE FF (TOWEL DISPOSABLE) ×2 IMPLANT
YANKAUER SUCT BULB TIP NO VENT (SUCTIONS) IMPLANT

## 2019-09-18 NOTE — Op Note (Signed)
DATE OF PROCEDURE: 09/18/2019  OPERATIVE REPORT   SURGEON: Newman Pies, MD   PREOPERATIVE DIAGNOSES:  1. Chronic nasal obstruction.  2. Bilateral inferior turbinate hypertrophy.   POSTOPERATIVE DIAGNOSES:  1. Chronic nasal obstruction.  2. Bilateral inferior turbinate hypertrophy.   PROCEDURE PERFORMED: Bilateral partial inferior turbinate resection.   ANESTHESIA: General endotracheal tube anesthesia.   COMPLICATIONS: None.   ESTIMATED BLOOD LOSS: Minimal.   INDICATION FOR PROCEDURE :Brad Rosales is a 58 y.o. male with a history of chronic nasal obstruction. The patient was treated with antihistamine, decongestant, and steroid nasal spray. However, the patient continued to be symptomatic. On examination, the patient was noted to have bilateral severe inferior turbinate hypertrophy, causing significant nasal obstruction. Based on the above findings, the decision was made for the patient to undergo the above-stated procedure. The risks, benefits, alternatives, and details of the procedure were discussed with the patient. Questions were invited and answered. Informed consent was obtained.   DESCRIPTION: The patient was taken to the operating room and placed supine on the operating table. General endotracheal tube anesthesia was administered by the anesthesiologist. The patient was positioned and prepped and draped in a standard fashion for nasal surgery. Pledgets soaked with Afrin were placed in both nasal cavities. The pledgets were subsequently removed. Examination of the nasal cavities revealed bilateral severe inferior turbinate hypertrophy. The inferior one-half of each inferior turbinate was then crossclamped with a straight Kelly clamp. The inferior one-half of each inferior turbinate was then resected with a pair of cross cutting scissors. Hemostasis was achieved with suction electrocautery, under direct visual guidance of the zero-degree endoscope. Good hemostasis was achieved. The care of  the patient was turned over to the anesthesiologist. The patient was awakened from anesthesia without difficulty. The patient was extubated and transferred to the recovery room in good condition.   OPERATIVE FINDINGS: Bilateral inferior turbinate hypertrophy.   SPECIMEN: None.   FOLLOWUP CARE: The patient will be discharged home once he is awake and alert. The patient will be placed on Percocet p.r.n. pain, and amoxicillin 875 mg p.o. b.i.d. for 5 days. The patient will follow up in my office in approximately 1 week.   Newman Pies, MD

## 2019-09-18 NOTE — Anesthesia Postprocedure Evaluation (Signed)
Anesthesia Post Note  Patient: Brad Rosales  Procedure(s) Performed: TURBINATE REDUCTION (Bilateral Nose)     Patient location during evaluation: PACU Anesthesia Type: General Level of consciousness: awake and alert Pain management: pain level controlled Vital Signs Assessment: post-procedure vital signs reviewed and stable Respiratory status: spontaneous breathing, nonlabored ventilation, respiratory function stable and patient connected to nasal cannula oxygen Cardiovascular status: blood pressure returned to baseline and stable Postop Assessment: no apparent nausea or vomiting Anesthetic complications: no    Last Vitals:  Vitals:   09/18/19 0946 09/18/19 1000  BP: 129/88 129/87  Pulse: (!) 51 (!) 53  Resp: 12 16  Temp:    SpO2: 100% 98%    Last Pain:  Vitals:   09/18/19 0945  TempSrc:   PainSc: 2                  Lannie Fields

## 2019-09-18 NOTE — Anesthesia Procedure Notes (Addendum)
Procedure Name: Intubation Date/Time: 09/18/2019 8:50 AM Performed by: Caren Macadam, CRNA Pre-anesthesia Checklist: Patient identified, Emergency Drugs available, Suction available and Patient being monitored Patient Re-evaluated:Patient Re-evaluated prior to induction Oxygen Delivery Method: Circle system utilized Preoxygenation: Pre-oxygenation with 100% oxygen Induction Type: IV induction Ventilation: Mask ventilation without difficulty Laryngoscope Size: Miller and 2 Grade View: Grade I Tube type: Oral Tube size: 8.0 mm Number of attempts: 1 Airway Equipment and Method: Stylet Placement Confirmation: ETT inserted through vocal cords under direct vision,  positive ETCO2 and breath sounds checked- equal and bilateral Secured at: 21 cm Tube secured with: Tape Dental Injury: Teeth and Oropharynx as per pre-operative assessment

## 2019-09-18 NOTE — Anesthesia Preprocedure Evaluation (Signed)
Anesthesia Evaluation  Patient identified by MRN, date of birth, ID band Patient awake    Reviewed: Allergy & Precautions, NPO status , Patient's Chart, lab work & pertinent test results  Airway Mallampati: II  TM Distance: >3 FB Neck ROM: Full    Dental  (+) Teeth Intact, Dental Advisory Given,    Pulmonary neg pulmonary ROS,    breath sounds clear to auscultation       Cardiovascular negative cardio ROS   Rhythm:Regular Rate:Normal     Neuro/Psych  Headaches, PSYCHIATRIC DISORDERS Anxiety Took valium last night and this AM again   GI/Hepatic negative GI ROS, Neg liver ROS,   Endo/Other  Hypothyroidism   Renal/GU negative Renal ROS  negative genitourinary   Musculoskeletal negative musculoskeletal ROS (+)   Abdominal Normal abdominal exam  (+)   Peds  Hematology negative hematology ROS (+)   Anesthesia Other Findings   Reproductive/Obstetrics negative OB ROS                             Anesthesia Physical Anesthesia Plan  ASA: II  Anesthesia Plan: General   Post-op Pain Management:    Induction: Intravenous  PONV Risk Score and Plan: 2 and Ondansetron, Dexamethasone, Midazolam and Treatment may vary due to age or medical condition  Airway Management Planned: Oral ETT  Additional Equipment: None  Intra-op Plan:   Post-operative Plan: Extubation in OR  Informed Consent: I have reviewed the patients History and Physical, chart, labs and discussed the procedure including the risks, benefits and alternatives for the proposed anesthesia with the patient or authorized representative who has indicated his/her understanding and acceptance.     Dental advisory given  Plan Discussed with: CRNA  Anesthesia Plan Comments:         Anesthesia Quick Evaluation

## 2019-09-18 NOTE — H&P (Signed)
Cc: Chronic nasal obstruction  HPI: The patient is a 59 y/o male who presents today with complaints of chronic nasal obstruction. The patient was last seen one year ago for evaluation of headaches and congestion. He was noted to have turbinate hypertrophy at that time, worse on his left side. The patient's headaches were felt to be neurogenic. The patient returns today noting progressive nasal congestion. His symptoms are worse at night. He is unable to tolerate steroid nasal sprays. The patient had a septoplasty in the past. No facial pain or pressure is noted.   Exam: The flexible scope was inserted into the right nasal cavity. Endoscopy of the interior nasal cavity, superior, inferior, and middle meatus was performed. The sphenoid-ethmoid recess was examined. Edematous mucosa was noted. No polyp, mass, or lesion was appreciated. Olfactory cleft was clear. Nasopharynx was clear. Turbinates were hypertrophied but without mass. Incomplete response to decongestion. The procedure was repeated on the contralateral side with similar findings. The patient tolerated the procedure well. Instructions were given to avoid eating or drinking for 2 hours.  Assessment: 1. Nasal mucosal congestion with bilateral inferior turbinate hypertrophy, causing severe nasal obstruction. Septum is midline. There is no purulent drainage or other suspicious mass or lesion.   Plan:   1. Nasal endoscopy findings are reviewed with the patient.  2. Treatment options included continued conservative management versus bilateral inferior turbinate reduction. The risks, benefits, details, and alternatives of the procedure are discussed with the patient. Questions are invited and answered. 3. The patient would like to proceed with the procedure. The procedure will be scheduled in accordance with the patient's schedule.

## 2019-09-18 NOTE — Discharge Instructions (Addendum)

## 2019-09-18 NOTE — Transfer of Care (Signed)
Immediate Anesthesia Transfer of Care Note  Patient: Brad Rosales  Procedure(s) Performed: Frederik Schmidt REDUCTION (Bilateral Nose)  Patient Location: PACU  Anesthesia Type:General  Level of Consciousness: awake  Airway & Oxygen Therapy: Patient Spontanous Breathing and Patient connected to face mask oxygen  Post-op Assessment: Report given to RN and Post -op Vital signs reviewed and stable  Post vital signs: Reviewed and stable  Last Vitals:  Vitals Value Taken Time  BP 135/87 09/18/19 0931  Temp    Pulse 58 09/18/19 0935  Resp 11 09/18/19 0935  SpO2 100 % 09/18/19 0935  Vitals shown include unvalidated device data.  Last Pain:  Vitals:   09/18/19 0751  TempSrc: Oral  PainSc: 0-No pain         Complications: No apparent anesthesia complications

## 2019-09-20 ENCOUNTER — Encounter: Payer: Self-pay | Admitting: Family Medicine

## 2019-09-21 ENCOUNTER — Encounter: Payer: Self-pay | Admitting: *Deleted

## 2019-09-21 ENCOUNTER — Telehealth: Payer: Self-pay | Admitting: Family Medicine

## 2019-09-21 NOTE — Telephone Encounter (Signed)
Lab results back. Filled out form that patient had brought in. Provider signature needed then can mail to patient. Form in provider office. Please advise. Thank you

## 2019-09-21 NOTE — Addendum Note (Signed)
Addendum  created 09/21/19 1771 by Lance Coon, CRNA   Charge Capture section accepted

## 2019-11-22 ENCOUNTER — Other Ambulatory Visit: Payer: Self-pay | Admitting: Family Medicine

## 2020-02-20 ENCOUNTER — Other Ambulatory Visit: Payer: Self-pay | Admitting: Family Medicine

## 2020-02-22 ENCOUNTER — Telehealth: Payer: Self-pay | Admitting: Family Medicine

## 2020-02-22 NOTE — Telephone Encounter (Signed)
Pt needs refill on EUTHYROX 75 MCG tablet [[id number]]  [facility] - [place], [place] - [address]   Pt call back [phone]

## 2020-02-22 NOTE — Telephone Encounter (Signed)
Scheduled 9/24

## 2020-02-23 MED ORDER — LEVOTHYROXINE SODIUM 75 MCG PO TABS: 75.0000 ug | ORAL_TABLET | Freq: Every day | ORAL | 0 refills | Status: DC

## 2020-02-23 NOTE — Telephone Encounter (Signed)
There is appt on 9/24 to est care with Dr Ladona Ridgel

## 2020-03-08 ENCOUNTER — Other Ambulatory Visit (INDEPENDENT_AMBULATORY_CARE_PROVIDER_SITE_OTHER): Payer: Self-pay | Admitting: Internal Medicine

## 2020-03-11 ENCOUNTER — Other Ambulatory Visit (INDEPENDENT_AMBULATORY_CARE_PROVIDER_SITE_OTHER): Payer: Self-pay | Admitting: Internal Medicine

## 2020-03-11 NOTE — Telephone Encounter (Signed)
I sent one refill of protonix to pharmacy but he was last seen March 2019 - will need OV for additional refills. Thanks.

## 2020-03-14 NOTE — Telephone Encounter (Signed)
Ive tried several times to reach Mr Belcastro by phone, I will keep trying

## 2020-03-14 NOTE — Telephone Encounter (Signed)
Noted. Thank you for update!

## 2020-03-18 ENCOUNTER — Ambulatory Visit: Payer: BLUE CROSS/BLUE SHIELD | Admitting: Family Medicine

## 2020-03-26 ENCOUNTER — Other Ambulatory Visit: Payer: Self-pay | Admitting: Family Medicine

## 2020-04-22 ENCOUNTER — Encounter: Payer: Self-pay | Admitting: Family Medicine

## 2020-04-22 ENCOUNTER — Other Ambulatory Visit: Payer: Self-pay

## 2020-04-22 ENCOUNTER — Ambulatory Visit (INDEPENDENT_AMBULATORY_CARE_PROVIDER_SITE_OTHER): Payer: BLUE CROSS/BLUE SHIELD | Admitting: Family Medicine

## 2020-04-22 VITALS — BP 116/74 | HR 71 | Temp 97.6°F | Ht 72.0 in | Wt 200.8 lb

## 2020-04-22 DIAGNOSIS — Z79899 Other long term (current) drug therapy: Secondary | ICD-10-CM | POA: Diagnosis not present

## 2020-04-22 DIAGNOSIS — E032 Hypothyroidism due to medicaments and other exogenous substances: Secondary | ICD-10-CM | POA: Diagnosis not present

## 2020-04-22 MED ORDER — DIAZEPAM 5 MG PO TABS: ORAL_TABLET | ORAL | 0 refills | Status: DC

## 2020-04-22 NOTE — Progress Notes (Signed)
Patient ID: Brad Rosales, male    DOB: Mar 07, 1961, 59 y.o.   MRN: [id number]   Chief Complaint  Patient presents with  . Establish Care   Subjective:    HPI  Pt seen to establish care.  F/u hypothyroidism and anxiety. Taking it prn valium.  occ taking during the day. Pt feeling sleeps better if he takes valium earlier in day. Pt took xanax in past. occ using to back pain also, this was the main reason at night. Still waking up at 3am even with the medication. Longstanding problem for years with insomnia.  med check up. Pt states no concerns today. Gad 7 done. Takes diazepam for anxiety.   Pt tried stuff in past for depression and "didn't like it."  Pt not gotten a vaccine yet for covid. Pt not wanting to take it, and feels it's all political.  Pt had nearly every day symptoms of increased anxiety and depression on the GAD 7, however, pt not wanting to take a SSRI or other medication for depression.  Medical History Brad Rosales has a past medical history of Allergy, Anxiety, ED (erectile dysfunction), Hypothyroidism, Migraines, Nasal turbinate hypertrophy, Paresthesia (11/24/2015), Sinus trouble, and Thyroid disease.   Outpatient Encounter Medications as of 04/22/2020  Medication Sig  . diazepam (VALIUM) 5 MG tablet TAKE 1 TABLET BY MOUTH AT BEDTIME AS NEEDED FOR MUSCLE SPASM OR  ANXIETY  . EUTHYROX 75 MCG tablet Take 1 tablet by mouth once daily  . pantoprazole (PROTONIX) 40 MG tablet Take 1 tablet (40 mg total) by mouth daily. Due for office visit for refills.  . [DISCONTINUED] diazepam (VALIUM) 5 MG tablet TAKE 1 TABLET BY MOUTH AT BEDTIME AS NEEDED FOR MUSCLE SPASM OR  ANXIETY   No facility-administered encounter medications on file as of 04/22/2020.     Review of Systems  Constitutional: Negative for chills and fever.  HENT: Negative for congestion, rhinorrhea and sore throat.   Respiratory: Negative for cough, shortness of breath and wheezing.   Cardiovascular: Negative  for chest pain and leg swelling.  Gastrointestinal: Negative for abdominal pain, diarrhea, nausea and vomiting.  Genitourinary: Negative for dysuria and frequency.  Skin: Negative for rash.  Neurological: Negative for dizziness, weakness and headaches.  Psychiatric/Behavioral: Positive for dysphoric mood and sleep disturbance. Negative for agitation, behavioral problems, confusion, decreased concentration, hallucinations, self-injury and suicidal ideas. The patient is nervous/anxious. The patient is not hyperactive.      Vitals BP 116/74   Pulse 71   Temp 97.6 F (36.4 C)   Ht 6' (1.829 m)   Wt 200 lb 12.8 oz (91.1 kg)   SpO2 96%   BMI 27.23 kg/m   Objective:   Physical Exam Vitals and nursing note reviewed.  Constitutional:      General: He is not in acute distress.    Appearance: Normal appearance. He is not ill-appearing.  HENT:     Head: Normocephalic.     Nose: Nose normal. No congestion.     Mouth/Throat:     Mouth: Mucous membranes are moist.     Pharynx: No oropharyngeal exudate.  Eyes:     Extraocular Movements: Extraocular movements intact.     Conjunctiva/sclera: Conjunctivae normal.     Pupils: Pupils are equal, round, and reactive to light.  Cardiovascular:     Rate and Rhythm: Normal rate and regular rhythm.     Pulses: Normal pulses.     Heart sounds: Normal heart sounds. No murmur heard.   Pulmonary:  Effort: Pulmonary effort is normal.     Breath sounds: Normal breath sounds. No wheezing, rhonchi or rales.  Musculoskeletal:        General: Normal range of motion.     Right lower leg: No edema.     Left lower leg: No edema.  Skin:    General: Skin is warm and dry.     Findings: No rash.  Neurological:     General: No focal deficit present.     Mental Status: He is alert and oriented to person, place, and time.     Cranial Nerves: No cranial nerve deficit.  Psychiatric:        Mood and Affect: Mood normal.        Behavior: Behavior normal.         Thought Content: Thought content normal.        Judgment: Judgment normal.      Assessment and Plan   1. Hypothyroidism due to medication - TSH  2. High risk medication use - ToxASSURE Select 13 (MW), Urine   Pt taking valium prn.  Discussed that we don't do 6 mo refills on controlled substances.  That pt come every 3 months for recheck and refills on controlled substances.  Pt voiced understanding. Pt stating just taking prn at night occ or during the day for anxiety. Controlled agreement and UDS ordered.  Pt to get more refills on euthyrox after labs resulted.  Last tsh in 2019, normal.  F/u 64mo cpe.

## 2020-04-23 LAB — TSH: TSH: 1.98 u[IU]/mL (ref 0.450–4.500)

## 2020-04-25 ENCOUNTER — Telehealth: Payer: Self-pay | Admitting: Family Medicine

## 2020-04-25 MED ORDER — LEVOTHYROXINE SODIUM 75 MCG PO TABS: 75.0000 ug | ORAL_TABLET | Freq: Every day | ORAL | 3 refills | Status: DC

## 2020-04-25 NOTE — Telephone Encounter (Signed)
Pt.notified

## 2020-04-27 LAB — TOXASSURE SELECT 13 (MW), URINE

## 2020-05-23 DIAGNOSIS — J343 Hypertrophy of nasal turbinates: Secondary | ICD-10-CM | POA: Diagnosis not present

## 2020-05-23 DIAGNOSIS — J310 Chronic rhinitis: Secondary | ICD-10-CM | POA: Diagnosis not present

## 2020-05-24 ENCOUNTER — Ambulatory Visit: Payer: BLUE CROSS/BLUE SHIELD | Admitting: Family Medicine

## 2020-07-12 ENCOUNTER — Telehealth: Payer: Self-pay | Admitting: Family Medicine

## 2020-07-12 ENCOUNTER — Other Ambulatory Visit: Payer: Self-pay

## 2020-07-12 ENCOUNTER — Encounter: Payer: Self-pay | Admitting: Family Medicine

## 2020-07-12 ENCOUNTER — Telehealth (INDEPENDENT_AMBULATORY_CARE_PROVIDER_SITE_OTHER): Payer: BLUE CROSS/BLUE SHIELD | Admitting: Family Medicine

## 2020-07-12 DIAGNOSIS — J329 Chronic sinusitis, unspecified: Secondary | ICD-10-CM

## 2020-07-12 DIAGNOSIS — R059 Cough, unspecified: Secondary | ICD-10-CM | POA: Insufficient documentation

## 2020-07-12 DIAGNOSIS — J310 Chronic rhinitis: Secondary | ICD-10-CM | POA: Diagnosis not present

## 2020-07-12 MED ORDER — ALBUTEROL SULFATE HFA 108 (90 BASE) MCG/ACT IN AERS: 2.0000 | INHALATION_SPRAY | Freq: Four times a day (QID) | RESPIRATORY_TRACT | 0 refills | Status: DC | PRN

## 2020-07-12 MED ORDER — AMOXICILLIN-POT CLAVULANATE 875-125 MG PO TABS: 1.0000 | ORAL_TABLET | Freq: Two times a day (BID) | ORAL | 0 refills | Status: DC

## 2020-07-12 MED ORDER — BENZONATATE 100 MG PO CAPS: 100.0000 mg | ORAL_CAPSULE | Freq: Two times a day (BID) | ORAL | 0 refills | Status: DC | PRN

## 2020-07-12 NOTE — Progress Notes (Signed)
Patient presents today with respiratory illness Number of days present- last friday  Symptoms include- pt wife states pt was "violently ill'; "cough very violent", cough led to vomiting/dry heaves, body aches, headache (migraine for 2 days), fever. Wife states pt has "extreme allergies"  Presence of worrisome signs (severe shortness of breath, lethargy, etc.) - no  Recent/current visit to urgent care or ER-no  Recent direct exposure to Covid-no  Any current Covid testing-no    Patient ID: Brad Rosales, male    DOB: [birth year], 60 y.o.   MRN: [id number]   Chief Complaint  Patient presents with  . Cough   Subjective:  CC: fever, chills, headache  This is a new problem.  Presents today via telephone visit with a complaint of fever, chills, headache x2 days, congestion and cough.  Reports T-max 101.0, last temperature was 99.4.  Symptoms started last Friday, feels a little better today however still coughing.  Has tried over-the-counter medications, NyQuil, cough medicine, Tylenol.  Has not been around any known COVID infection, has not been recently tested for COVID.  Feels like he has a sinus infection.    Medical History Loron has a past medical history of Allergy, Anxiety, ED (erectile dysfunction), Hypothyroidism, Migraines, Nasal turbinate hypertrophy, Paresthesia (11/24/2015), Sinus trouble, and Thyroid disease.   Outpatient Encounter Medications as of 07/12/2020  Medication Sig  . albuterol (VENTOLIN HFA) 108 (90 Base) MCG/ACT inhaler Inhale 2 puffs into the lungs every 6 (six) hours as needed for wheezing or shortness of breath.  Marland Kitchen amoxicillin-clavulanate (AUGMENTIN) 875-125 MG tablet Take 1 tablet by mouth 2 (two) times daily.  . benzonatate (TESSALON) 100 MG capsule Take 1 capsule (100 mg total) by mouth 2 (two) times daily as needed for cough.  . diazepam (VALIUM) 5 MG tablet TAKE 1 TABLET BY MOUTH AT BEDTIME AS NEEDED FOR MUSCLE SPASM OR  ANXIETY  . levothyroxine  (EUTHYROX) 75 MCG tablet Take 1 tablet (75 mcg total) by mouth daily.  . pantoprazole (PROTONIX) 40 MG tablet Take 1 tablet (40 mg total) by mouth daily. Due for office visit for refills.   No facility-administered encounter medications on file as of 07/12/2020.     Review of Systems  Constitutional: Positive for chills, fatigue and fever.  HENT: Positive for congestion, sinus pressure and sinus pain. Negative for ear pain.   Respiratory: Positive for cough and chest tightness. Negative for shortness of breath.        With breathing  Cardiovascular: Negative for chest pain.  Gastrointestinal: Positive for abdominal pain, nausea and vomiting.  Musculoskeletal: Positive for myalgias.  Neurological: Positive for headaches.       Resolved     Vitals There were no vitals taken for this visit. Does not have a pulse ox meter.   Objective:   Physical Exam  Unable to perform.  Able to converse throughout interview today with out obvious shortness of breath. Assessment and Plan   1. Rhinosinusitis - amoxicillin-clavulanate (AUGMENTIN) 875-125 MG tablet; Take 1 tablet by mouth 2 (two) times daily.  Dispense: 20 tablet; Refill: 0  2. Cough - albuterol (VENTOLIN HFA) 108 (90 Base) MCG/ACT inhaler; Inhale 2 puffs into the lungs every 6 (six) hours as needed for wheezing or shortness of breath.  Dispense: 8 g; Refill: 0 - benzonatate (TESSALON) 100 MG capsule; Take 1 capsule (100 mg total) by mouth 2 (two) times daily as needed for cough.  Dispense: 20 capsule; Refill: 0   Reports sinus pain and pressure, feels  this is a sinus infection will treat with antibiotics for 10 days.  Understands that this could possibly be viral in nature and/or COVID-19.  Will treat cough with albuterol due to complaint of chest tightness, and Tessalon Perles.  Recommend supportive therapy, adequate hydration, and over-the-counter symptom treatment.  Agrees with plan of care discussed today. Understands warning  signs to seek further care: chest pain, shortness of breath, any significant change in health.  Understands to follow-up if symptoms do not improve, or worsen.  Recommend COVID testing if symptoms do not improve in 1 to 2 days, information given.  COVID-19 respiratory warnings given as stated below.   Covid-19 warning:  Covid-19 is a virus that causes hypoxia (low oxygen level in blood) in some people. If you develop any changes in your usual breathing pattern: difficulty catching your breath, more short winded with activity or with resting, or anything that concerns you about your breathing, do not hesitate to go to the emergency department immediately for evaluation. Covid infection can also affect the way the brain functions if it lacks oxygen, such as, feeling dizzy, passing out, or feeling confused, if you experience any of these symptoms, please do not delay to seek treatment.  Some people experience gastrointestinal problems with Covid, such as vomiting and diarrhea, dehydration is a serious risk and should be avoided. If you are unable to keep liquids down you may need to go to the emergency department for intravenous fluids to avoid dehydration.   Please alert and involve your family and/or friends to help keep an eye on you while you recover from Covid-19. If you have any questions or concerns about your recovery, please do not hesitate to call the office for guidance.     Novella Olive, NP 07/12/2020  Virtual Visit via Telephone Note  I connected with Moises Blood Behl on 07/12/20 at  1:30 PM EST by telephone and verified that I am speaking with the correct person using two identifiers.  Location: Patient: home Provider: office   I discussed the limitations, risks, security and privacy concerns of performing an evaluation and management service by telephone and the availability of in person appointments. I also discussed with the patient that there may be a patient responsible charge  related to this service. The patient expressed understanding and agreed to proceed.   History of Present Illness:    Observations/Objective:   Assessment and Plan:   Follow Up Instructions:    I discussed the assessment and treatment plan with the patient. The patient was provided an opportunity to ask questions and all were answered. The patient agreed with the plan and demonstrated an understanding of the instructions.   The patient was advised to call back or seek an in-person evaluation if the symptoms worsen or if the condition fails to improve as anticipated.  I provided 14 minutes of non-face-to-face time during this encounter.

## 2020-07-12 NOTE — Telephone Encounter (Signed)
Mr. Casino,you are scheduled for a virtual visit with your provider today.    Just as we do with appointments in the office, we must obtain your consent to participate.  Your consent will be active for this visit and any virtual visit you may have with one of our providers in the next 365 days.    If you have a MyChart account, I can also send a copy of this consent to you electronically.  All virtual visits are billed to your insurance company just like a traditional visit in the office.  As this is a virtual visit, video technology does not allow for your provider to perform a traditional examination.  This may limit your provider's ability to fully assess your condition.  If your provider identifies any concerns that need to be evaluated in person or the need to arrange testing such as labs, EKG, etc, we will make arrangements to do so.    Although advances in technology are sophisticated, we cannot ensure that it will always work on either your end or our end.  If the connection with a video visit is poor, we may have to switch to a telephone visit.  With either a video or telephone visit, we are not always able to ensure that we have a secure connection.   I need to obtain your verbal consent now.   Are you willing to proceed with your visit today?   Commodore Bellew Weissberg has provided verbal consent on 07/12/2020 for a virtual visit (video or telephone).   Marlowe Shores, LPN 2/80/0349  17:91 PM

## 2020-07-14 ENCOUNTER — Other Ambulatory Visit: Payer: Self-pay | Admitting: Family Medicine

## 2020-07-14 ENCOUNTER — Telehealth: Payer: Self-pay | Admitting: Family Medicine

## 2020-07-14 DIAGNOSIS — R059 Cough, unspecified: Secondary | ICD-10-CM

## 2020-07-14 MED ORDER — GUAIFENESIN-CODEINE 100-10 MG/5ML PO SOLN: 5.0000 mL | Freq: Three times a day (TID) | ORAL | 0 refills | Status: DC | PRN

## 2020-07-14 NOTE — Telephone Encounter (Signed)
Patient is requesting stronger cough medicine due to the fact he is coughing all night long. He is requesting hydrocodone cough syrup. He had phone visit on 1/18//21.[facility]

## 2020-07-14 NOTE — Progress Notes (Signed)
Patient requesting something stronger for cough.

## 2020-07-15 ENCOUNTER — Ambulatory Visit
Admission: EM | Admit: 2020-07-15 | Discharge: 2020-07-15 | Disposition: A | Discharge status: Home / Self Care | Payer: BLUE CROSS/BLUE SHIELD | Attending: Family Medicine | Admitting: Family Medicine

## 2020-07-15 ENCOUNTER — Encounter: Payer: Self-pay | Admitting: Emergency Medicine

## 2020-07-15 ENCOUNTER — Ambulatory Visit (INDEPENDENT_AMBULATORY_CARE_PROVIDER_SITE_OTHER): Payer: BLUE CROSS/BLUE SHIELD

## 2020-07-15 ENCOUNTER — Other Ambulatory Visit: Payer: Self-pay

## 2020-07-15 ENCOUNTER — Other Ambulatory Visit: Payer: Self-pay | Admitting: Family Medicine

## 2020-07-15 DIAGNOSIS — R0602 Shortness of breath: Secondary | ICD-10-CM | POA: Diagnosis not present

## 2020-07-15 DIAGNOSIS — R062 Wheezing: Secondary | ICD-10-CM | POA: Diagnosis not present

## 2020-07-15 DIAGNOSIS — J9811 Atelectasis: Secondary | ICD-10-CM

## 2020-07-15 DIAGNOSIS — R059 Cough, unspecified: Secondary | ICD-10-CM

## 2020-07-15 MED ORDER — PREDNISONE 10 MG (21) PO TBPK: ORAL_TABLET | Freq: Every day | ORAL | 0 refills | Status: AC

## 2020-07-15 NOTE — Discharge Instructions (Addendum)
Chest xray today shows bibasilar atelactasis  Continue medications from your PCP  I have sent in a prednisone taper for you to take for 6 days. 6 tablets on day one, 5 tablets on day two, 4 tablets on day three, 3 tablets on day four, 2 tablets on day five, and 1 tablet on day six.  Follow up with this office or with primary care if symptoms are persisting.  Follow up in the ER for high fever, trouble swallowing, trouble breathing, other concerning symptoms.

## 2020-07-15 NOTE — ED Triage Notes (Signed)
Ears stopped up, cough, sinus congestion x 1 week.

## 2020-07-15 NOTE — ED Provider Notes (Signed)
[facility]   [id number] 07/15/20 Arrival Time: 7829   CC: COVID symptoms  SUBJECTIVE: History from: patient.  Brad Rosales is a 60 y.o. male who presents with ear fullness, cough, sinus congestion x 7 days. Has been taking Augmentin from PCP since 07/12/20 with no relief. He has also been using tessalon perles and albuterol inhaler, nyquil with no relief. Denies sick exposure to COVID, flu or strep. Denies recent travel. Has negative history of Covid. Has not completed Covid vaccines. Cough, SOB, fatigue is worse with activity. Denies previous symptoms in the past. Denies fever, sinus pain, rhinorrhea, sore throat, wheezing, chest pain, nausea, changes in bowel or bladder habits.    ROS: As per HPI.  All other pertinent ROS negative.     Past Medical History:  Diagnosis Date  . Allergy    seasonal  . Anxiety   . ED (erectile dysfunction)   . Hypothyroidism   . Migraines   . Nasal turbinate hypertrophy   . Paresthesia 11/24/2015  . Sinus trouble   . Thyroid disease    hyprothyroid   Past Surgical History:  Procedure Laterality Date  . COLONOSCOPY WITH PROPOFOL N/A 08/23/2017   Procedure: COLONOSCOPY WITH PROPOFOL;  Surgeon: Malissa Hippo, MD;  [facility]: [facility];  Service: Endoscopy;  Laterality: N/A;  10:30  . ESOPHAGEAL DILATION N/A 08/23/2017   Procedure: ESOPHAGEAL DILATION;  Surgeon: Malissa Hippo, MD;  Location: [facility];  Service: Endoscopy;  Laterality: N/A;  . ESOPHAGOGASTRODUODENOSCOPY (EGD) WITH PROPOFOL N/A 08/23/2017   Procedure: ESOPHAGOGASTRODUODENOSCOPY (EGD) WITH PROPOFOL;  Surgeon: Malissa Hippo, MD;  Location: [facility];  Service: Endoscopy;  Laterality: N/A;  . HAND SURGERY Bilateral    Right thumb and Left thumb  . NOSE SURGERY    . scrotal cyst    . TURBINATE REDUCTION Bilateral 09/18/2019   Procedure: TURBINATE REDUCTION;  Surgeon: Newman Pies, MD;  Location:  SURGERY CENTER;  Service: ENT;  Laterality: Bilateral;    No Known Allergies No current facility-administered medications on file prior to encounter.   Current Outpatient Medications on File Prior to Encounter  Medication Sig Dispense Refill  . albuterol (VENTOLIN HFA) 108 (90 Base) MCG/ACT inhaler Inhale 2 puffs into the lungs every 6 (six) hours as needed for wheezing or shortness of breath. 8 g 0  . amoxicillin-clavulanate (AUGMENTIN) 875-125 MG tablet Take 1 tablet by mouth 2 (two) times daily. 20 tablet 0  . benzonatate (TESSALON) 100 MG capsule Take 1 capsule (100 mg total) by mouth 2 (two) times daily as needed for cough. 20 capsule 0  . diazepam (VALIUM) 5 MG tablet TAKE 1 TABLET BY MOUTH AT BEDTIME AS NEEDED FOR MUSCLE SPASM OR  ANXIETY 30 tablet 0  . guaiFENesin-codeine 100-10 MG/5ML syrup Take 5 mLs by mouth 3 (three) times daily as needed for cough. 120 mL 0  . levothyroxine (EUTHYROX) 75 MCG tablet Take 1 tablet (75 mcg total) by mouth daily. 90 tablet 3  . pantoprazole (PROTONIX) 40 MG tablet Take 1 tablet (40 mg total) by mouth daily. Due for office visit for refills. 30 tablet 0   Social History   Socioeconomic History  . Marital status: Married    Spouse name: Not on file  . Number of children: 1  . Years of education: GED  . Highest education level: Not on file  Occupational History  . Occupation: Grading  Tobacco Use  . Smoking status: Never Smoker  . Smokeless tobacco: Never  Used  Substance and Sexual Activity  . Alcohol use: No  . Drug use: No  . Sexual activity: Yes  Other Topics Concern  . Not on file  Social History Narrative   Lives at home w/ his wife   Erskin Burnet w/ right hand but uses both hands   Drinks a cup of coffee each morning   Social Determinants of Health   Financial Resource Strain: Not on file  Food Insecurity: Not on file  Transportation Needs: Not on file  Physical Activity: Not on file  Stress: Not on file  Social Connections: Not on file  Intimate Partner Violence: Not on file    Family History  Problem Relation Age of Onset  . Hypertension Father   . Congestive Heart Failure Father   . Dementia Mother     OBJECTIVE:  Vitals:   07/15/20 0832  BP: 109/76  Pulse: 76  Resp: 16  Temp: 98.1 F (36.7 C)  TempSrc: Tympanic  SpO2: 95%     General appearance: alert; appears fatigued, but nontoxic; speaking in full sentences and tolerating own secretions HEENT: NCAT; Ears: EACs clear, TMs pearly gray; Eyes: PERRL.  EOM grossly intact. Sinuses: nontender; Nose: nares patent with clear rhinorrhea, Throat: oropharynx erythematous, cobblestoning present, tonsils non erythematous or enlarged, uvula midline  Neck: supple without LAD Lungs: unlabored respirations, symmetrical air entry; cough: moderate; no respiratory distress; coarse lung sounds with wheezing throughout bilateral lung fields, diminished sounds to bilateral bases Heart: regular rate and rhythm.  Radial pulses 2+ symmetrical bilaterally Skin: warm and dry Psychological: alert and cooperative; normal mood and affect  LABS:  No results found for this or any previous visit (from the past 24 hour(s)).   ASSESSMENT & PLAN:  1. Mild bibasilar atelectasis   2. Cough   3. SOB (shortness of breath)   4. Wheezing     Meds ordered this encounter  Medications  . predniSONE (STERAPRED UNI-PAK 21 TAB) 10 MG (21) TBPK tablet    Sig: Take by mouth daily for 6 days. Take 6 tablets on day 1, 5 tablets on day 2, 4 tablets on day 3, 3 tablets on day 4, 2 tablets on day 5, 1 tablet on day 6    Dispense:  21 tablet    Refill:  0    Order Specific Question:   Supervising Provider    Answer:   Merrilee Jansky [id number]    Chest xray shows bibasilar atelectasis Prescribed steroid taper Pick up your cough syrup from the pharmacy Continue supportive care at home COVID and flu testing ordered.  It will take between 2-3 days for test results. Someone will contact you regarding abnormal results.   Work note  provided Patient should remain in quarantine until they have received Covid results.  If negative you may resume normal activities (go back to work/school) while practicing hand hygiene, social distance, and mask wearing.  If positive, patient should remain in quarantine for at least 5 days from symptom onset AND greater than 72 hours after symptoms resolution (absence of fever without the use of fever-reducing medication and improvement in respiratory symptoms), whichever is longer Get plenty of rest and push fluids Use OTC zyrtec for nasal congestion, runny nose, and/or sore throat Use OTC flonase for nasal congestion and runny nose Use medications daily for symptom relief Use OTC medications like ibuprofen or tylenol as needed fever or pain Call or go to the ED if you have any new or worsening symptoms such  as fever, worsening cough, shortness of breath, chest tightness, chest pain, turning blue, changes in mental status.  Reviewed expectations re: course of current medical issues. Questions answered. Outlined signs and symptoms indicating need for more acute intervention. Patient verbalized understanding. After Visit Summary given.         Moshe Cipro, NP 07/15/20 [phone]

## 2020-07-16 NOTE — Telephone Encounter (Signed)
Pt needs follow up appt on this medication/anxiety. Small amt given till appt, in the next 30 days. Dr. Ladona Ridgel

## 2020-07-17 LAB — COVID-19, FLU A+B NAA
Influenza A, NAA: NOT DETECTED
Influenza B, NAA: NOT DETECTED
SARS-CoV-2, NAA: DETECTED — AB

## 2020-07-22 ENCOUNTER — Other Ambulatory Visit: Payer: Self-pay

## 2020-07-22 ENCOUNTER — Ambulatory Visit
Admission: EM | Admit: 2020-07-22 | Discharge: 2020-07-22 | Disposition: A | Discharge status: Home / Self Care | Payer: BLUE CROSS/BLUE SHIELD | Attending: Emergency Medicine | Admitting: Emergency Medicine

## 2020-07-22 ENCOUNTER — Encounter: Payer: Self-pay | Admitting: Emergency Medicine

## 2020-07-22 DIAGNOSIS — R058 Other specified cough: Secondary | ICD-10-CM | POA: Diagnosis not present

## 2020-07-22 DIAGNOSIS — R059 Cough, unspecified: Secondary | ICD-10-CM

## 2020-07-22 DIAGNOSIS — H6593 Unspecified nonsuppurative otitis media, bilateral: Secondary | ICD-10-CM

## 2020-07-22 DIAGNOSIS — Z20822 Contact with and (suspected) exposure to covid-19: Secondary | ICD-10-CM

## 2020-07-22 DIAGNOSIS — U071 COVID-19: Secondary | ICD-10-CM

## 2020-07-22 MED ORDER — GUAIFENESIN-CODEINE 100-10 MG/5ML PO SOLN: 5.0000 mL | Freq: Three times a day (TID) | ORAL | 0 refills | Status: DC | PRN

## 2020-07-22 MED ORDER — FLUTICASONE PROPIONATE 50 MCG/ACT NA SUSP: 1.0000 | Freq: Every day | NASAL | 0 refills | Status: DC

## 2020-07-22 NOTE — ED Triage Notes (Signed)
Ears stopped by, cough, congestion, covid positive test from a week ago

## 2020-07-22 NOTE — ED Provider Notes (Signed)
[facility]   [id number] 07/22/20 Arrival Time: 0802   CC: COVID Infections SUBJECTIVE: History from: patient.  Brad Rosales is a 60 y.o. male who presents to the urgent care with a complaint of being Covid positive for the past 7 days and worsening cough, and ear feel stopped up.  Denies sick exposure to  flu or strep.  Denies recent travel.  Has tried prednisone, codeine with guaifenesin and Augmentin with mild relief.  Patient is here to get guanfacine with codeine refill.  Denies alleviating or aggravating factors.  Denies previous symptoms in the past.   Denies fever, chills, fatigue, sinus pain, rhinorrhea, sore throat, SOB, wheezing, chest pain, nausea, changes in bowel or bladder habits.     ROS: As per HPI.  All other pertinent ROS negative.     Past Medical History:  Diagnosis Date  . Allergy    seasonal  . Anxiety   . ED (erectile dysfunction)   . Hypothyroidism   . Migraines   . Nasal turbinate hypertrophy   . Paresthesia 11/24/2015  . Sinus trouble   . Thyroid disease    hyprothyroid   Past Surgical History:  Procedure Laterality Date  . COLONOSCOPY WITH PROPOFOL N/A 08/23/2017   Procedure: COLONOSCOPY WITH PROPOFOL;  Surgeon: Malissa Hippo, MD;  [facility];  Service: Endoscopy;  Laterality: N/A;  10:30  . ESOPHAGEAL DILATION N/A 08/23/2017   Procedure: ESOPHAGEAL DILATION;  Surgeon: Malissa Hippo, MD;  [facility]: [facility];  Service: Endoscopy;  Laterality: N/A;  . ESOPHAGOGASTRODUODENOSCOPY (EGD) WITH PROPOFOL N/A 08/23/2017   Procedure: ESOPHAGOGASTRODUODENOSCOPY (EGD) WITH PROPOFOL;  Surgeon: Malissa Hippo, MD;  Location: [facility];  Service: Endoscopy;  Laterality: N/A;  . HAND SURGERY Bilateral    Right thumb and Left thumb  . NOSE SURGERY    . scrotal cyst    . TURBINATE REDUCTION Bilateral 09/18/2019   Procedure: TURBINATE REDUCTION;  Surgeon: Newman Pies, MD;  Location: [facility];  Service: ENT;  Laterality:  Bilateral;   No Known Allergies No current facility-administered medications on file prior to encounter.   Current Outpatient Medications on File Prior to Encounter  Medication Sig Dispense Refill  . albuterol (VENTOLIN HFA) 108 (90 Base) MCG/ACT inhaler Inhale 2 puffs into the lungs every 6 (six) hours as needed for wheezing or shortness of breath. 8 g 0  . amoxicillin-clavulanate (AUGMENTIN) 875-125 MG tablet Take 1 tablet by mouth 2 (two) times daily. 20 tablet 0  . benzonatate (TESSALON) 100 MG capsule Take 1 capsule (100 mg total) by mouth 2 (two) times daily as needed for cough. 20 capsule 0  . diazepam (VALIUM) 5 MG tablet TAKE 1 TABLET BY MOUTH AT BEDTIME AS NEEDED FOR MUSCLE SPASM OR ANXIETY 30 tablet 0  . levothyroxine (EUTHYROX) 75 MCG tablet Take 1 tablet (75 mcg total) by mouth daily. 90 tablet 3  . pantoprazole (PROTONIX) 40 MG tablet Take 1 tablet (40 mg total) by mouth daily. Due for office visit for refills. 30 tablet 0   Social History   Socioeconomic History  . Marital status: Married    Spouse name: Not on file  . Number of children: 1  . Years of education: GED  . Highest education level: Not on file  Occupational History  . Occupation: Grading  Tobacco Use  . Smoking status: Never Smoker  . Smokeless tobacco: Never Used  Substance and Sexual Activity  . Alcohol use: No  . Drug  use: No  . Sexual activity: Yes  Other Topics Concern  . Not on file  Social History Narrative   Lives at home w/ his wife   Erskin Burnet w/ right hand but uses both hands   Drinks a cup of coffee each morning   Social Determinants of Health   Financial Resource Strain: Not on file  Food Insecurity: Not on file  Transportation Needs: Not on file  Physical Activity: Not on file  Stress: Not on file  Social Connections: Not on file  Intimate Partner Violence: Not on file   Family History  Problem Relation Age of Onset  . Hypertension Father   . Congestive Heart Failure Father    . Dementia Mother     OBJECTIVE:  Vitals:   07/22/20 0835 07/22/20 0836  BP: 123/76   Pulse: 64   Resp: 18   Temp: 98.4 F (36.9 C)   TempSrc: Oral   SpO2: 93%   Weight:  195 lb (88.5 kg)  Height:  6' (1.829 m)     General appearance: alert; appears fatigued, but nontoxic; speaking in full sentences and tolerating own secretions HEENT: NCAT; Ears: EACs clear, bilateral TM with middle ear effusion; Eyes: PERRL.  EOM grossly intact. Sinuses: nontender; Nose: nares patent without rhinorrhea, Throat: oropharynx clear, tonsils non erythematous or enlarged, uvula midline  Neck: supple without LAD Lungs: unlabored respirations, symmetrical air entry; cough: moderate; no respiratory distress; CTAB Heart: regular rate and rhythm.  Radial pulses 2+ symmetrical bilaterally Skin: warm and dry Psychological: alert and cooperative; normal mood and affect  LABS:  No results found for this or any previous visit (from the past 24 hour(s)).   ASSESSMENT & PLAN:  1. Cough with exposure to COVID-19 virus   2. COVID-19 virus infection   3. Middle ear effusion, bilateral   4. Cough     Meds ordered this encounter  Medications  . guaiFENesin-codeine 100-10 MG/5ML syrup    Sig: Take 5 mLs by mouth 3 (three) times daily as needed for cough.    Dispense:  118 mL    Refill:  0  . fluticasone (FLONASE) 50 MCG/ACT nasal spray    Sig: Place 1 spray into both nostrils daily for 14 days.    Dispense:  16 g    Refill:  0    Discharge Instructions   Get plenty of rest and push fluids Codeine with guaifenesin was prescribed for cough Flonase for nasal congestion and runny nose Continue prednisone as prescribed and directed Stop taking Augmentin as Covid is a virus and not bacterial Use medications daily for symptom relief Use OTC medications like ibuprofen or tylenol as needed fever or pain Call or go to the ED if you have any new or worsening symptoms such as fever, worsening cough,  shortness of breath, chest tightness, chest pain, turning blue, changes in mental status, etc...   Reviewed expectations re: course of current medical issues. Questions answered. Outlined signs and symptoms indicating need for more acute intervention. Patient verbalized understanding. After Visit Summary given.         Durward Parcel, FNP 07/22/20 [phone]

## 2020-07-22 NOTE — Discharge Instructions (Signed)
Get plenty of rest and push fluids Codeine with guaifenesin was prescribed for cough Flonase for nasal congestion and runny nose Continue prednisone as prescribed and directed Stop taking Augmentin as Covid is a virus and not bacterial Use medications daily for symptom relief Use OTC medications like ibuprofen or tylenol as needed fever or pain Call or go to the ED if you have any new or worsening symptoms such as fever, worsening cough, shortness of breath, chest tightness, chest pain, turning blue, changes in mental status, etc..Marland Kitchen

## 2020-07-22 NOTE — Telephone Encounter (Signed)
Please contact patient to have him schedule appt. Thank you 

## 2020-07-22 NOTE — Telephone Encounter (Signed)
LM to schedule Med Check 

## 2020-09-14 ENCOUNTER — Telehealth: Payer: Self-pay

## 2020-09-14 ENCOUNTER — Ambulatory Visit: Payer: BLUE CROSS/BLUE SHIELD | Admitting: Family Medicine

## 2020-09-14 MED ORDER — DIAZEPAM 5 MG PO TABS: ORAL_TABLET | ORAL | 0 refills | Status: DC

## 2020-09-14 NOTE — Telephone Encounter (Signed)
Please advise. Thank you

## 2020-09-14 NOTE — Telephone Encounter (Signed)
Will give small amt till visit next week, and needs to address at his appt with carolyn.   Thx dr. Ladona Ridgel

## 2020-09-14 NOTE — Telephone Encounter (Signed)
Patient was on the schedule for a physical today but was bumped to April 8th.  Needs a refill on his diazepam 5 mg and he is unsure about his thyroid medicine.  Looks like he has plenty of refills but said he only got #50 at his last refill.  [facility] in [place]

## 2020-09-14 NOTE — Telephone Encounter (Signed)
Pt contacted and verbalized understanding.  

## 2020-09-30 ENCOUNTER — Ambulatory Visit (INDEPENDENT_AMBULATORY_CARE_PROVIDER_SITE_OTHER): Payer: BLUE CROSS/BLUE SHIELD | Admitting: Nurse Practitioner

## 2020-09-30 ENCOUNTER — Other Ambulatory Visit: Payer: Self-pay

## 2020-09-30 ENCOUNTER — Encounter: Payer: Self-pay | Admitting: Nurse Practitioner

## 2020-09-30 VITALS — BP 122/73 | HR 75 | Temp 97.4°F | Ht 71.0 in | Wt 212.0 lb

## 2020-09-30 DIAGNOSIS — Z0000 Encounter for general adult medical examination without abnormal findings: Secondary | ICD-10-CM

## 2020-09-30 NOTE — Progress Notes (Signed)
   Subjective:    Patient ID: Brad Rosales, male    DOB: 1961/04/30, 60 y.o.   MRN: [id number]  HPI   The patient comes in today for a wellness visit for his insurance/health savings account. Describes diet as normal and eats anything. Does not get much regular physical exercise due to busy work schedule. Drives a truck. Denies smoking, alcohol consumption, and illicit drugs.   Get eyes exams every 2 yrs and up to date with dental check up. Up to date with vaccines except shingles.   Declines covid and flu shots.      A review of their health history was completed.  A review of medications was also completed.  Any needed refills; valium and levothyroxine  Eating habits: not health consicous  Falls/  MVA accidents in past few months: none  Regular exercise: none  Specialist pt sees on regular basis: none  Preventative health issues were discussed.   Additional concerns: none   Review of Systems  Constitutional: Negative for fatigue.  Respiratory: Negative for cough, chest tightness, shortness of breath and wheezing.   Cardiovascular: Negative for chest pain, palpitations and leg swelling.  Gastrointestinal: Negative.  Negative for abdominal pain, constipation, diarrhea, nausea, rectal pain and vomiting.  Genitourinary: Negative for difficulty urinating, dysuria, frequency, genital sores, penile pain, penile swelling, scrotal swelling, testicular pain and urgency.       No change in stream.         Objective:   Physical Exam Constitutional:      General: He is not in acute distress.    Appearance: Normal appearance. He is normal weight. He is not ill-appearing.  Cardiovascular:     Rate and Rhythm: Normal rate and regular rhythm.     Heart sounds: Normal heart sounds.  Pulmonary:     Effort: Pulmonary effort is normal. No respiratory distress.     Breath sounds: Normal breath sounds. No wheezing.  Neurological:     Mental Status: He is alert and oriented to  person, place, and time.  Psychiatric:        Mood and Affect: Mood normal.        Behavior: Behavior normal.        Thought Content: Thought content normal.        Judgment: Judgment normal.   Defers GU exam: denies any problems.  Today's Vitals   09/30/20 1503  BP: 122/73  Pulse: 75  Temp: (!) 97.4 F (36.3 C)  SpO2: 95%  Weight: 212 lb (96.2 kg)  Height: 5\' 11"  (1.803 m)   Body mass index is 29.57 kg/m.  Weight gain of 17 lbs since January. Question how much of this is related to his heavy clothing.      Assessment & Plan:   Problem List Items Addressed This Visit   None   Visit Diagnoses    Routine general medical examination at a health care facility    -  Primary     Forms completed for his employer. Last labs from 09/14/19 were included with date.   Encouraged patient to get up to date labs. Patient declines at this time. Will get up to date labs once if insurance declines previous results.   Encouraged patient to engage in more physical activities.    Return in about 1 year (around 09/30/2021).

## 2020-09-30 NOTE — Progress Notes (Signed)
   Subjective:    Patient ID: Brad Rosales, male    DOB: Apr 18, 1961, 60 y.o.   MRN: [id number]  HPI The patient comes in today for a wellness visit.    A review of their health history was completed.  A review of medications was also completed.  Any needed refills; valium and levothyroxine  Eating habits: not health consicous  Falls/  MVA accidents in past few months: none  Regular exercise: none  Specialist pt sees on regular basis: none  Preventative health issues were discussed.   Additional concerns: none    Review of Systems     Objective:   Physical Exam        Assessment & Plan:

## 2020-10-01 ENCOUNTER — Encounter: Payer: Self-pay | Admitting: Nurse Practitioner

## 2020-10-04 ENCOUNTER — Telehealth: Payer: Self-pay | Admitting: Family Medicine

## 2020-10-04 NOTE — Telephone Encounter (Signed)
Do not see anything in note about sleep. Did yall discuss or does he need an appt to discuss insomnia

## 2020-10-04 NOTE — Telephone Encounter (Signed)
Patient is wanting something to help him sleep He is requesting Ambien. He was seen by Eber Jones on 4/8. Wife states he doesn't need it often.  [facility]

## 2020-10-05 NOTE — Telephone Encounter (Signed)
Please schedule

## 2020-10-05 NOTE — Telephone Encounter (Signed)
I agree. Nothing specific at that visit about sleep. He will need at least a phone or video visit to discuss with a provider to discuss options. Thanks.

## 2020-10-05 NOTE — Telephone Encounter (Signed)
Left message for patient to call and schedule appointment.

## 2020-10-10 NOTE — Telephone Encounter (Signed)
Patient will call back to schedule appointment.

## 2020-11-09 ENCOUNTER — Other Ambulatory Visit: Payer: Self-pay

## 2020-11-09 ENCOUNTER — Ambulatory Visit
Admission: EM | Admit: 2020-11-09 | Discharge: 2020-11-09 | Disposition: A | Discharge status: Home / Self Care | Payer: BLUE CROSS/BLUE SHIELD | Attending: Family Medicine | Admitting: Family Medicine

## 2020-11-09 ENCOUNTER — Encounter: Payer: Self-pay | Admitting: Emergency Medicine

## 2020-11-09 DIAGNOSIS — R059 Cough, unspecified: Secondary | ICD-10-CM

## 2020-11-09 DIAGNOSIS — J069 Acute upper respiratory infection, unspecified: Secondary | ICD-10-CM

## 2020-11-09 MED ORDER — CETIRIZINE-PSEUDOEPHEDRINE ER 5-120 MG PO TB12: 1.0000 | ORAL_TABLET | Freq: Every day | ORAL | 0 refills | Status: DC

## 2020-11-09 MED ORDER — GUAIFENESIN-CODEINE 100-10 MG/5ML PO SYRP: 5.0000 mL | ORAL_SOLUTION | Freq: Three times a day (TID) | ORAL | 0 refills | Status: DC | PRN

## 2020-11-09 NOTE — ED Provider Notes (Signed)
[facility]    CSN: [id number] Arrival date & time: 11/09/20  0802      History   Chief Complaint Chief Complaint  Patient presents with  . Cough    HPI Brad Rosales is a 60 y.o. male.   HPI  Past Medical History:  Diagnosis Date  . Allergy    seasonal  . Anxiety   . ED (erectile dysfunction)   . Hypothyroidism   . Migraines   . Nasal turbinate hypertrophy   . Paresthesia 11/24/2015  . Sinus trouble   . Thyroid disease    hyprothyroid    Patient Active Problem List   Diagnosis Date Noted  . Rhinosinusitis 07/12/2020  . Cough 07/12/2020  . Esophageal dysphagia 08/06/2017  . Special screening for malignant neoplasms, colon 08/06/2017  . Paresthesia 11/24/2015  . Chronic back pain 05/08/2015  . Hypothyroidism 09/14/2012  . Insomnia 09/12/2012  . Allergic rhinitis 09/12/2012    Past Surgical History:  Procedure Laterality Date  . COLONOSCOPY WITH PROPOFOL N/A 08/23/2017   Procedure: COLONOSCOPY WITH PROPOFOL;  Surgeon: Malissa Hippo, MD;  Location: [facility];  Service: Endoscopy;  Laterality: N/A;  10:30  . ESOPHAGEAL DILATION N/A 08/23/2017   Procedure: ESOPHAGEAL DILATION;  Surgeon: Malissa Hippo, MD;  Location: [facility];  Service: Endoscopy;  Laterality: N/A;  . ESOPHAGOGASTRODUODENOSCOPY (EGD) WITH PROPOFOL N/A 08/23/2017   Procedure: ESOPHAGOGASTRODUODENOSCOPY (EGD) WITH PROPOFOL;  Surgeon: Malissa Hippo, MD;  Location: [facility];  Service: Endoscopy;  Laterality: N/A;  . HAND SURGERY Bilateral    Right thumb and Left thumb  . NOSE SURGERY    . scrotal cyst    . TURBINATE REDUCTION Bilateral 09/18/2019   Procedure: TURBINATE REDUCTION;  Surgeon: Newman Pies, MD;  Location: [facility];  Service: ENT;  Laterality: Bilateral;       Home Medications    Prior to Admission medications   Medication Sig Start Date End Date Taking? Authorizing Provider  cetirizine-pseudoephedrine (ZYRTEC-D) 5-120 MG tablet Take 1  tablet by mouth daily. 11/09/20  Yes Moshe Cipro, NP  guaiFENesin-codeine (ROBITUSSIN AC) 100-10 MG/5ML syrup Take 5 mLs by mouth 3 (three) times daily as needed for cough. 11/09/20  Yes Moshe Cipro, NP  diazepam (VALIUM) 5 MG tablet Take 1 tab p.o. daily prn spasm/anxiety. 09/14/20   Ladona Ridgel, Malena M, DO  fluticasone (FLONASE) 50 MCG/ACT nasal spray Place 1 spray into both nostrils daily for 14 days. 07/22/20 08/05/20  Avegno, Zachery Dakins, FNP  levothyroxine (EUTHYROX) 75 MCG tablet Take 1 tablet (75 mcg total) by mouth daily. 04/25/20   Annalee Genta, DO    Family History Family History  Problem Relation Age of Onset  . Hypertension Father   . Congestive Heart Failure Father   . Dementia Mother     Social History Social History   Tobacco Use  . Smoking status: Never Smoker  . Smokeless tobacco: Never Used  Substance Use Topics  . Alcohol use: No  . Drug use: No     Allergies   Patient has no known allergies.   Review of Systems Review of Systems   Physical Exam Triage Vital Signs ED Triage Vitals  Enc Vitals Group     BP      Pulse      Resp      Temp      Temp src      SpO2      Weight  Height      Head Circumference      Peak Flow      Pain Score      Pain Loc      Pain Edu?      Excl. in GC?    No data found.  Updated Vital Signs BP 131/87 (BP Location: Right Arm)   Pulse 98   Temp 99.3 F (37.4 C) (Oral)   Resp 19   SpO2 95%       Physical Exam Vitals and nursing note reviewed.  Constitutional:      General: He is not in acute distress.    Appearance: Normal appearance. He is well-developed and normal weight. He is not ill-appearing.  HENT:     Head: Normocephalic and atraumatic.     Right Ear: Tympanic membrane, ear canal and external ear normal.     Left Ear: Tympanic membrane, ear canal and external ear normal.     Nose: Congestion present.     Mouth/Throat:     Mouth: Mucous membranes are moist.     Pharynx:  Oropharynx is clear. Posterior oropharyngeal erythema present.  Eyes:     Extraocular Movements: Extraocular movements intact.     Conjunctiva/sclera: Conjunctivae normal.     Pupils: Pupils are equal, round, and reactive to light.  Cardiovascular:     Rate and Rhythm: Normal rate and regular rhythm.     Heart sounds: Normal heart sounds. No murmur heard.   Pulmonary:     Effort: Pulmonary effort is normal. No respiratory distress.     Breath sounds: No stridor. No rhonchi or rales.  Chest:     Chest wall: No tenderness.  Abdominal:     Palpations: Abdomen is soft.     Tenderness: There is no abdominal tenderness.  Musculoskeletal:        General: Normal range of motion.     Cervical back: Normal range of motion and neck supple.  Lymphadenopathy:     Cervical: No cervical adenopathy.  Skin:    General: Skin is warm and dry.     Capillary Refill: Capillary refill takes less than 2 seconds.  Neurological:     General: No focal deficit present.     Mental Status: He is alert and oriented to person, place, and time.  Psychiatric:        Mood and Affect: Mood normal.        Behavior: Behavior normal.        Thought Content: Thought content normal.      UC Treatments / Results  Labs (all labs ordered are listed, but only abnormal results are displayed) Labs Reviewed  COVID-19, FLU A+B NAA    EKG   Radiology No results found.  Procedures Procedures (including critical care time)  Medications Ordered in UC Medications - No data to display  Initial Impression / Assessment and Plan / UC Course  I have reviewed the triage vital signs and the nursing notes.  Pertinent labs & imaging results that were available during my care of the patient were reviewed by me and considered in my medical decision making (see chart for details).    Viral URI with cough  Zyrtec-D prescribed to take daily Cheratussin cough syrup prescribed Sedation cautions given Work note  provided Covid and flu swab obtained in office today.   Patient instructed to quarantine until results are back and negative.   If results are negative, patient may resume daily schedule as tolerated once  they are fever free for 24 hours without the use of antipyretic medications.   If results are positive, patient instructed to quarantine for at least 5 days from symptom onset.  If after 5 days symptoms have resolved, may return to work with a well fitting mask for the next 5 days. If symptomatic after day 5, isolation should be extended to 10 days. Patient instructed to follow-up with primary care or with this office as needed.   Patient instructed to follow-up in the ER for trouble swallowing, trouble breathing, other concerning symptoms.   Final Clinical Impressions(s) / UC Diagnoses   Final diagnoses:  Cough  Viral URI with cough     Discharge Instructions     Your COVID and Influenza tests are pending.  You should self quarantine until the test results are back.    Take Tylenol or ibuprofen as needed for fever or discomfort.  Rest and keep yourself hydrated.    Follow-up with your primary care provider if your symptoms are not improving.        ED Prescriptions    Medication Sig Dispense Auth. Provider   cetirizine-pseudoephedrine (ZYRTEC-D) 5-120 MG tablet Take 1 tablet by mouth daily. 30 tablet Moshe Cipro, NP   guaiFENesin-codeine (ROBITUSSIN AC) 100-10 MG/5ML syrup Take 5 mLs by mouth 3 (three) times daily as needed for cough. 240 mL Moshe Cipro, NP     PDMP not reviewed this encounter.   Moshe Cipro, NP 11/09/20 [phone]

## 2020-11-09 NOTE — ED Triage Notes (Addendum)
Cough that started on Friday, nasal congestion and  had cold chills last night.

## 2020-11-09 NOTE — Discharge Instructions (Addendum)
Your COVID and Influenza tests are pending.  You should self quarantine until the test results are back.    Take Tylenol or ibuprofen as needed for fever or discomfort.  Rest and keep yourself hydrated.    Follow-up with your primary care provider if your symptoms are not improving.     

## 2020-11-10 ENCOUNTER — Ambulatory Visit (INDEPENDENT_AMBULATORY_CARE_PROVIDER_SITE_OTHER): Payer: BLUE CROSS/BLUE SHIELD | Admitting: Family Medicine

## 2020-11-10 ENCOUNTER — Other Ambulatory Visit: Payer: Self-pay

## 2020-11-10 ENCOUNTER — Encounter: Payer: Self-pay | Admitting: Family Medicine

## 2020-11-10 VITALS — HR 110 | Temp 97.9°F | Resp 16 | Wt 200.0 lb

## 2020-11-10 DIAGNOSIS — R059 Cough, unspecified: Secondary | ICD-10-CM | POA: Diagnosis not present

## 2020-11-10 DIAGNOSIS — J069 Acute upper respiratory infection, unspecified: Secondary | ICD-10-CM

## 2020-11-10 LAB — COVID-19, FLU A+B NAA
Influenza A, NAA: NOT DETECTED
Influenza B, NAA: NOT DETECTED
SARS-CoV-2, NAA: NOT DETECTED

## 2020-11-10 NOTE — Patient Instructions (Signed)
Viral Respiratory Infection A viral respiratory infection is an illness that affects parts of the body that are used for breathing. These include the lungs, nose, and throat. It is caused by a germ called a virus. Some examples of this kind of infection are:  A cold.  The flu (influenza).  A respiratory syncytial virus (RSV) infection. A person who gets this illness may have the following symptoms:  A stuffy or runny nose.  Yellow or green fluid in the nose.  A cough.  Sneezing.  Tiredness (fatigue).  Achy muscles.  A sore throat.  Sweating or chills.  A fever.  A headache. Follow these instructions at home: Managing pain and congestion  Take over-the-counter and prescription medicines only as told by your doctor.  If you have a sore throat, gargle with salt water. Do this 3-4 times per day or as needed. To make a salt-water mixture, dissolve -1 tsp of salt in 1 cup of warm water. Make sure that all the salt dissolves.  Use nose drops made from salt water. This helps with stuffiness (congestion). It also helps soften the skin around your nose.  Drink enough fluid to keep your pee (urine) pale yellow. General instructions  Rest as much as possible.  Do not drink alcohol.  Do not use any products that have nicotine or tobacco, such as cigarettes and e-cigarettes. If you need help quitting, ask your doctor.  Keep all follow-up visits as told by your doctor. This is important.   How is this prevented?  Get a flu shot every year. Ask your doctor when you should get your flu shot.  Do not let other people get your germs. If you are sick: ? Stay home from work or school. ? Wash your hands with soap and water often. Wash your hands after you cough or sneeze. If soap and water are not available, use hand sanitizer.  Avoid contact with people who are sick during cold and flu season. This is in fall and winter.   Get help if:  Your symptoms last for 10 days or  longer.  Your symptoms get worse over time.  You have a fever.  You have very bad pain in your face or forehead.  Parts of your jaw or neck become very swollen. Get help right away if:  You feel pain or pressure in your chest.  You have shortness of breath.  You faint or feel like you will faint.  You keep throwing up (vomiting).  You feel confused. Summary  A viral respiratory infection is an illness that affects parts of the body that are used for breathing.  Examples of this illness include a cold, the flu, and respiratory syncytial virus (RSV) infection.  The infection can cause a runny nose, cough, sneezing, sore throat, and fever.  Follow what your doctor tells you about taking medicines, drinking lots of fluid, washing your hands, resting at home, and avoiding people who are sick. This information is not intended to replace advice given to you by your health care provider. Make sure you discuss any questions you have with your health care provider. Document Revised: 06/19/2018 Document Reviewed: 07/22/2017 Elsevier Patient Education  2021 Elsevier Inc.  

## 2020-11-10 NOTE — Progress Notes (Signed)
Patient ID: Brad Rosales, male    DOB: 21-Jan-1961, 60 y.o.   MRN: [id number]   Chief Complaint  Patient presents with  . Cough   Subjective:  CC: cough with mucous   This is a new problem.  Presents today for an acute visit with a complaint of cough and congestion.  Reports that he was seen at the urgent care yesterday, COVID and flu test performed, does not have results yet.  Symptoms have been present since Friday.  Reports that he has had a fever and fatigue, T-max 100.3.  Associated symptoms include congestion, sinus burning, and sore throat which has resolved.  Reports he has having some shortness of breath with exertion.  To note he had a positive COVID infection 3 months ago.  He was prescribed Cheratussin cough medicine by the urgent care yesterday.  Pt having cough and congestion. Was seen at Urgent Care yesterday and they did flu and COVID test; no results at this time. Pt has also been running fever.    Medical History Daxtin has a past medical history of Allergy, Anxiety, ED (erectile dysfunction), Hypothyroidism, Migraines, Nasal turbinate hypertrophy, Paresthesia (11/24/2015), Sinus trouble, and Thyroid disease.   Outpatient Encounter Medications as of 11/10/2020  Medication Sig  . cetirizine-pseudoephedrine (ZYRTEC-D) 5-120 MG tablet Take 1 tablet by mouth daily.  . diazepam (VALIUM) 5 MG tablet Take 1 tab p.o. daily prn spasm/anxiety.  Marland Kitchen guaiFENesin-codeine (ROBITUSSIN AC) 100-10 MG/5ML syrup Take 5 mLs by mouth 3 (three) times daily as needed for cough.  . levothyroxine (EUTHYROX) 75 MCG tablet Take 1 tablet (75 mcg total) by mouth daily.  . fluticasone (FLONASE) 50 MCG/ACT nasal spray Place 1 spray into both nostrils daily for 14 days.   No facility-administered encounter medications on file as of 11/10/2020.     Review of Systems  Constitutional: Positive for fatigue and fever (T-max 100.3 ).  HENT: Positive for congestion, sinus pain (burning) and sore throat  (resolved). Negative for ear pain (popping ears).   Respiratory: Positive for cough and shortness of breath (with exertion).      Vitals Pulse (!) 110   Temp 97.9 F (36.6 C)   Resp 16   Wt 200 lb (90.7 kg)   SpO2 94%   BMI 27.89 kg/m   Objective:   Physical Exam Vitals reviewed.  HENT:     Right Ear: Tympanic membrane normal.     Left Ear: Tympanic membrane normal.     Nose:     Right Sinus: No maxillary sinus tenderness or frontal sinus tenderness.     Left Sinus: No maxillary sinus tenderness or frontal sinus tenderness.     Mouth/Throat:     Pharynx: Uvula midline. No oropharyngeal exudate or posterior oropharyngeal erythema.  Cardiovascular:     Rate and Rhythm: Normal rate and regular rhythm.     Heart sounds: Normal heart sounds.  Pulmonary:     Effort: Pulmonary effort is normal.     Breath sounds: Normal breath sounds.  Skin:    General: Skin is warm and dry.  Neurological:     General: No focal deficit present.     Mental Status: He is alert.  Psychiatric:        Behavior: Behavior normal.      Assessment and Plan   1. Cough  2. Viral upper respiratory tract infection   Upper viral  respiratory infection with cough.  Recommend supportive therapy, adequate hydration, bland diet, continue with cough  medication prescribed by the urgent care.  TMs normal today, lungs clear, pharynx clear,  no clear indication of bacterial infection, no antibiotics indicated.    Agrees with plan of care discussed today. Understands warning signs to seek further care: chest pain, shortness of breath, any significant change in health.  Understands to follow-up if symptoms worsen, do not improve.  Await results of flu and COVID test done by urgent care, urgent care will notify patient once available .Request refill on Valium during visit, will need to schedule follow-up with PCP.      Novella Olive, NP 11/10/2020

## 2020-11-11 ENCOUNTER — Telehealth: Payer: Self-pay | Admitting: Family Medicine

## 2020-11-11 ENCOUNTER — Other Ambulatory Visit: Payer: Self-pay | Admitting: Family Medicine

## 2020-11-11 MED ORDER — NIRMATRELVIR/RITONAVIR (PAXLOVID)TABLET: 3.0000 | ORAL_TABLET | Freq: Two times a day (BID) | ORAL | 0 refills | Status: AC

## 2020-11-11 NOTE — Telephone Encounter (Signed)
Patient is requesting antibiotic for cough. He was seen at Urgent care on 5/18 and seen yesterday here for cough also. Please advise

## 2020-11-11 NOTE — Telephone Encounter (Signed)
Coughing all day and all night. Last night pt states he was having "bubbling" in lungs but woke up and coughed up a lot of stuff Taking cough meds and tylenol Covid test pending Fever has subsided-100.3 was the highest at one time Coughed up a lot of blood with mucus last night Pt is having nasal drainage-feels like his nose is full of boogers Starting out had sore throat; ears have been getting stopped up this morning No hx of smoking in the past.  Pt states "he wishes he could get an antibiotic" Please advise. Thank you

## 2020-11-11 NOTE — Telephone Encounter (Signed)
Pt contacted and verbalized understanding. Pt will go to [facility] to pick up med.

## 2020-11-11 NOTE — Telephone Encounter (Signed)
Left message to return call 

## 2020-11-11 NOTE — Telephone Encounter (Signed)
Pls call pt, let him know about positive covid testing.   And if he wants to take the paxlovid medication, anti-viral.   It is going to be at [facility] if he wants it.    Covid 19 recommendations- It's symptomatic tx for viral illness. Take mucinex as directed, could change to delsym if needed.  Increase fluid intake. Vit C, vit D and zinc are recommended vitamins to take.  zinc dose of 75 mg-100mg . daily doses of vitamin D3 (1,000-4,000 IU), vitamin C (500 mg), and melatonin (0.3mg -2 mg each night).  Flonase for nasal congestion.   Thx.   Dr. Ladona Ridgel

## 2020-11-16 ENCOUNTER — Telehealth: Payer: Self-pay | Admitting: Family Medicine

## 2020-11-16 NOTE — Telephone Encounter (Signed)
Left message to return call 

## 2020-11-16 NOTE — Telephone Encounter (Signed)
Patient is requesting refill valium 5mg  called into [facility]

## 2020-11-16 NOTE — Telephone Encounter (Signed)
  Pt needs to be seen for this medication.   Dr. Ladona Ridgel

## 2020-11-18 ENCOUNTER — Other Ambulatory Visit: Payer: Self-pay | Admitting: Family Medicine

## 2020-11-23 ENCOUNTER — Telehealth: Payer: Self-pay

## 2020-11-23 NOTE — Telephone Encounter (Signed)
Left message to return call 

## 2020-11-23 NOTE — Telephone Encounter (Signed)
Pt advised on last visit in 10/21 that needs appt every 3 months for this medication.  No refills till appt.   Dr. Ladona Ridgel

## 2020-11-23 NOTE — Telephone Encounter (Signed)
Patient returned call and scheduled follow up office visit for 12/02/20 with Dr Ladona Ridgel- new message taken requesting refill till office visit was sent to Dr Ladona Ridgel

## 2020-11-23 NOTE — Telephone Encounter (Signed)
Patient scheduled appt 12/02/20 with Dr Ladona Ridgel

## 2020-11-23 NOTE — Telephone Encounter (Signed)
Pt [facility]iazepam ([facility]UM) [id number] [place] [id number]   Pt call back [phone]

## 2020-11-29 NOTE — Telephone Encounter (Signed)
Pt contacted and verbalized understanding.  

## 2020-12-02 ENCOUNTER — Other Ambulatory Visit: Payer: Self-pay

## 2020-12-02 ENCOUNTER — Ambulatory Visit (INDEPENDENT_AMBULATORY_CARE_PROVIDER_SITE_OTHER): Payer: BLUE CROSS/BLUE SHIELD | Admitting: Family Medicine

## 2020-12-02 VITALS — BP 110/80 | HR 91 | Temp 97.9°F | Ht 71.0 in | Wt 198.0 lb

## 2020-12-02 DIAGNOSIS — F32A Depression, unspecified: Secondary | ICD-10-CM | POA: Diagnosis not present

## 2020-12-02 DIAGNOSIS — I872 Venous insufficiency (chronic) (peripheral): Secondary | ICD-10-CM | POA: Insufficient documentation

## 2020-12-02 DIAGNOSIS — F419 Anxiety disorder, unspecified: Secondary | ICD-10-CM | POA: Insufficient documentation

## 2020-12-02 DIAGNOSIS — I498 Other specified cardiac arrhythmias: Secondary | ICD-10-CM

## 2020-12-02 MED ORDER — DIAZEPAM 5 MG PO TABS: ORAL_TABLET | ORAL | 2 refills | Status: DC

## 2020-12-02 MED ORDER — TRIAMCINOLONE ACETONIDE 0.1 % EX CREA: 1.0000 "application " | TOPICAL_CREAM | Freq: Two times a day (BID) | CUTANEOUS | 0 refills | Status: DC

## 2020-12-02 MED ORDER — SERTRALINE HCL 50 MG PO TABS: 50.0000 mg | ORAL_TABLET | Freq: Every day | ORAL | 1 refills | Status: DC

## 2020-12-02 NOTE — Progress Notes (Signed)
Patient ID: Brad Rosales, male    DOB: [birth year], 60 y.o.   MRN: [id number]   Chief Complaint  Patient presents with   Anxiety   Depression   Palpitations   Subjective:    HPI Anxiety/depression med check up.  Takes valium for anxiety. Does not take every day. Would like to see if he could try something else for anxiety. Has felt heart fluttering and mentioned sometimes it feels as if something is wrong with his heart.   Pt states when he wakes up in the mornings he shakes and wonders if his sugar is low. States its been going on for years.   Felt like heart skips a beat, yesterday while driving. Pt feeling it's his anxiety. No pain, nausea.  Had covid 19- 1 mo ago. Coughing at time.  On and off with fluttering, noticing more when stressed. Pt has positive score for depression today. And lots of anxiety. Has been taking Valium 5mg , in chart since 2014. Started it with back spasms in lower back and was using for his back, then became using it for anxiety.   Pt stating taking valium early in day.  Not lasting till night time. Occ has violent dreams with valium.  1 cup coffee in am, Occ red bull- when feeling tired.  Inside in mornings feeling jittery inside when waking up. Sinus surgery 1 yr ago.   And in 62mo for thyroid meds.  Medical History Brad Rosales has a past medical history of Allergy, Anxiety, ED (erectile dysfunction), Hypothyroidism, Migraines, Nasal turbinate hypertrophy, Paresthesia (11/24/2015), Sinus trouble, and Thyroid disease.   Outpatient Encounter Medications as of 12/02/2020  Medication Sig   levothyroxine (EUTHYROX) 75 MCG tablet Take 1 tablet (75 mcg total) by mouth daily.   sertraline (ZOLOFT) 50 MG tablet Take 1 tablet (50 mg total) by mouth daily. Take 1/2 tab p.o. for 7 days, then increase to 1 tab daily.   triamcinolone cream (KENALOG) 0.1 % Apply 1 application topically 2 (two) times daily. On leg rash.   [DISCONTINUED]  cetirizine-pseudoephedrine (ZYRTEC-D) 5-120 MG tablet Take 1 tablet by mouth daily.   [DISCONTINUED] diazepam (VALIUM) 5 MG tablet Take 1 tab p.o. daily prn spasm/anxiety.   diazepam (VALIUM) 5 MG tablet Take 1 tab p.o. daily prn spasm/anxiety.   [DISCONTINUED] fluticasone (FLONASE) 50 MCG/ACT nasal spray Place 1 spray into both nostrils daily for 14 days.   [DISCONTINUED] guaiFENesin-codeine (ROBITUSSIN AC) 100-10 MG/5ML syrup Take 5 mLs by mouth 3 (three) times daily as needed for cough.   No facility-administered encounter medications on file as of 12/02/2020.     Review of Systems  Constitutional:  Negative for chills and fever.  HENT:  Negative for congestion, rhinorrhea and sore throat.   Respiratory:  Negative for cough, shortness of breath and wheezing.   Cardiovascular:  Positive for palpitations. Negative for chest pain and leg swelling.  Gastrointestinal:  Negative for abdominal pain, diarrhea, nausea and vomiting.  Genitourinary:  Negative for dysuria and frequency.  Skin:  Negative for rash.  Neurological:  Positive for tremors. Negative for dizziness, weakness and headaches.  Psychiatric/Behavioral:  Positive for dysphoric mood and sleep disturbance. Negative for self-injury and suicidal ideas. The patient is nervous/anxious.     Vitals BP 110/80   Pulse 91   Temp 97.9 F (36.6 C)   Ht 5\' 11"  (1.803 m)   Wt 198 lb (89.8 kg)   SpO2 99%   BMI 27.62 kg/m   Objective:   Physical Exam  Vitals reviewed.  Constitutional:      Appearance: Normal appearance.  HENT:     Head: Normocephalic.     Nose: Nose normal. No congestion.     Mouth/Throat:     Mouth: Mucous membranes are moist.     Pharynx: No oropharyngeal exudate.  Eyes:     Extraocular Movements: Extraocular movements intact.     Conjunctiva/sclera: Conjunctivae normal.     Pupils: Pupils are equal, round, and reactive to light.  Cardiovascular:     Rate and Rhythm: Normal rate and regular rhythm.      Pulses: Normal pulses.     Heart sounds: Normal heart sounds. No murmur heard. Pulmonary:     Effort: Pulmonary effort is normal.     Breath sounds: Normal breath sounds. No wheezing, rhonchi or rales.  Musculoskeletal:        General: Normal range of motion.     Right lower leg: No edema.     Left lower leg: No edema.  Skin:    General: Skin is warm and dry.     Findings: No rash.  Neurological:     General: No focal deficit present.     Mental Status: He is alert and oriented to person, place, and time.  Psychiatric:        Mood and Affect: Mood normal.        Behavior: Behavior normal.     Assessment and Plan   1. Anxiety - sertraline (ZOLOFT) 50 MG tablet; Take 1 tablet (50 mg total) by mouth daily. Take 1/2 tab p.o. for 7 days, then increase to 1 tab daily.  Dispense: 30 tablet; Refill: 1 - diazepam (VALIUM) 5 MG tablet; Take 1 tab p.o. daily prn spasm/anxiety.  Dispense: 30 tablet; Refill: 2  2. 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 Depression, unspecified depression type - sertraline (ZOLOFT) 50 MG tablet; Take 1 tablet (50 mg total) by mouth daily. Take 1/2 tab p.o. for 7 days, then increase to 1 tab daily.  Dispense: 30 tablet; Refill: 1  5. Venous stasis dermatitis of both lower extremities - triamcinolone cream (KENALOG) 0.1 %; Apply 1 application topically 2 (two) times daily. On leg rash.  Dispense: 60 g; Refill: 0   Depression/anxiety- Advising pt to add zoloft 50mg  to see if will help with his anxiety.  Pt still wanting to cont with valium at night even though has some nightmares and not as effective.  Occ heart fluttering- ekg normal sinus rhythm, no st elevation or ischemic changes.  Heart regular rate and rhythm today.  Advising may need referral to cards for holter monitor if continues.  Pt to call or rto if worsening.  Pt to call or rto if worsening chest palpitations, sob, or chest pain.  Pt declining labs at this time to  recheck his thyroid.  Wanting to wait till next visit in 11/22.  F/u 5wks to recheck depression/anxiety and in 69mo for thyroid medications.  Return in about 5 weeks (around 01/06/2021) for f/u anxiety-phone visit.

## 2021-01-06 ENCOUNTER — Telehealth: Payer: Self-pay | Admitting: Family Medicine

## 2021-01-06 ENCOUNTER — Telehealth (INDEPENDENT_AMBULATORY_CARE_PROVIDER_SITE_OTHER): Payer: BC Managed Care – PPO | Admitting: Family Medicine

## 2021-01-06 ENCOUNTER — Other Ambulatory Visit: Payer: Self-pay

## 2021-01-06 DIAGNOSIS — E032 Hypothyroidism due to medicaments and other exogenous substances: Secondary | ICD-10-CM | POA: Diagnosis not present

## 2021-01-06 DIAGNOSIS — F419 Anxiety disorder, unspecified: Secondary | ICD-10-CM

## 2021-01-06 MED ORDER — DIAZEPAM 5 MG PO TABS: ORAL_TABLET | ORAL | 1 refills | Status: DC

## 2021-01-06 MED ORDER — LEVOTHYROXINE SODIUM 75 MCG PO TABS
75.0000 ug | ORAL_TABLET | Freq: Every day | ORAL | 1 refills | Status: DC
Start: 2021-01-06 — End: 2021-05-12

## 2021-01-06 NOTE — Telephone Encounter (Signed)
[name],you are scheduled for a virtual visit with your provider today.    Just as we do with appointments in the office, we must obtain your consent to participate.  Your consent will be active for this visit and any virtual visit you may have with one of our providers in the next 365 days.    If you have a MyChart account, I can also send a copy of this consent to you electronically.  All virtual visits are billed to your insurance company just like a traditional visit in the office.  As this is a virtual visit, video technology does not allow for your provider to perform a traditional examination.  This may limit your provider's ability to fully assess your condition.  If your provider identifies any concerns that need to be evaluated in person or the need to arrange testing such as labs, EKG, etc, we will make arrangements to do so.    Although advances in technology are sophisticated, we cannot ensure that it will always work on either your end or our end.  If the connection with a video visit is poor, we may have to switch to a telephone visit.  With either a video or telephone visit, we are not always able to ensure that we have a secure connection.   I need to obtain your verbal consent now.   Are you willing to proceed with your visit today?   Brad Rosales has provided verbal consent on 01/06/2021 for a virtual visit (video or telephone).   Marlowe Shores, LPN 02/04/7516  0:01 AM

## 2021-01-06 NOTE — Progress Notes (Signed)
Virtual Visit via Telephone Note  I connected with Brad Rosales on 01/06/21 at  9:00 AM EDT by telephone and verified that I am speaking with the correct person using two identifiers.  Location: Patient: home Provider: office   I discussed the limitations, risks, security and privacy concerns of performing an evaluation and management service by telephone and the availability of in person appointments. I also discussed with the patient that there may be a patient responsible charge related to this service. The patient expressed understanding and agreed to proceed.      Patient ID: Brad Rosales, male    DOB: Oct 12, 1960, 60 y.o.   MRN: [id number]   Chief Complaint  Patient presents with   Anxiety   Subjective:    HPI Pt was seen on 12/02/20 for anxiety and provider requested follow up in 5 weeks for recheck. Pt states he stopped taking the Zoloft because he did not like the way it made him feel. States he only took it about 2 or 3 days. Pt states he would like to continue getting the Valium. Pt states he is doing well.   Last visit zoloft started, pt discontinued.   Not having any further palpiations and all stopped was "going through some stuff" No chest pain now.   Medical History Whitaker has a past medical history of Allergy, Anxiety, ED (erectile dysfunction), Hypothyroidism, Migraines, Nasal turbinate hypertrophy, Paresthesia (11/24/2015), Sinus trouble, and Thyroid disease.   Outpatient Encounter Medications as of 01/06/2021  Medication Sig   triamcinolone cream (KENALOG) 0.1 % Apply 1 application topically 2 (two) times daily. On leg rash.   [DISCONTINUED] diazepam (VALIUM) 5 MG tablet Take 1 tab p.o. daily prn spasm/anxiety.   [DISCONTINUED] levothyroxine (EUTHYROX) 75 MCG tablet Take 1 tablet (75 mcg total) by mouth daily.   diazepam (VALIUM) 5 MG tablet Take 1 tab p.o. daily prn spasm/anxiety.   levothyroxine (EUTHYROX) 75 MCG tablet Take 1 tablet (75 mcg total) by  mouth daily.   [DISCONTINUED] sertraline (ZOLOFT) 50 MG tablet Take 1 tablet (50 mg total) by mouth daily. Take 1/2 tab p.o. for 7 days, then increase to 1 tab daily.   No facility-administered encounter medications on file as of 01/06/2021.     Review of Systems  Constitutional:  Negative for chills and fever.  HENT:  Negative for congestion, rhinorrhea and sore throat.   Respiratory:  Negative for cough, shortness of breath and wheezing.   Cardiovascular:  Negative for chest pain and leg swelling.  Gastrointestinal:  Negative for abdominal pain, diarrhea, nausea and vomiting.  Genitourinary:  Negative for dysuria and frequency.  Skin:  Negative for rash.  Neurological:  Negative for dizziness, weakness and headaches.  Psychiatric/Behavioral:  Negative for dysphoric mood, self-injury, sleep disturbance and suicidal ideas. The patient is nervous/anxious.     Vitals There were no vitals taken for this visit.  Objective:   Physical Exam  No PE due to phone visit.  Assessment and Plan   1. Hypothyroidism due to medication - levothyroxine (EUTHYROX) 75 MCG tablet; Take 1 tablet (75 mcg total) by mouth daily.  Dispense: 90 tablet; Refill: 1  2. Anxiety - diazepam (VALIUM) 5 MG tablet; Take 1 tab p.o. daily prn spasm/anxiety.  Dispense: 30 tablet; Refill: 1   Anxiety/depression- stable. Tried zoloft didn't like how he felt. Pt discontinued.  Not wanting to take a different ssri, ex: celexa or lexapro.  Pt wanting to stay on valium.  Hsa been on this in chart  since 2014.  Discussed that taking benzodiazepines over long term causes increase risk of memory concerns and falls.  Pt voiced understanding. Advised pt to try to decrease this over time and take 1/2 tab prn.  Advising if his anxiety is not controlled would need to try to get on SSRI or SNRI.  Pt declining for now.   Reviewed pmp.  Last fill 12/02/20 for valium.  Pt stating no further palpiations or chest pain.  Advised if  having any concerns to call or rto. Pt in agreement.  Hypothyroid- stable. Cont meds.  Has been on this dose since 2014.    Return in about 4 months (around 05/09/2021) for f/u anxiety/thyroid.    Follow Up Instructions:    I discussed the assessment and treatment plan with the patient. The patient was provided an opportunity to ask questions and all were answered. The patient agreed with the plan and demonstrated an understanding of the instructions.   The patient was advised to call back or seek an in-person evaluation if the symptoms worsen or if the condition fails to improve as anticipated.  I provided 15 minutes of non-face-to-face time during this encounter.

## 2021-01-21 ENCOUNTER — Encounter: Payer: Self-pay | Admitting: Family Medicine

## 2021-04-09 ENCOUNTER — Other Ambulatory Visit: Payer: Self-pay | Admitting: Family Medicine

## 2021-04-09 DIAGNOSIS — F419 Anxiety disorder, unspecified: Secondary | ICD-10-CM

## 2021-05-11 ENCOUNTER — Telehealth: Payer: Self-pay | Admitting: Family Medicine

## 2021-05-11 NOTE — Telephone Encounter (Signed)
Patient has appointment 12/5 and is requesting a refill on diazepam and levothyroxine until appointment. Please advise.    [facility]  CB#  [phone]

## 2021-05-12 ENCOUNTER — Other Ambulatory Visit: Payer: Self-pay | Admitting: Family Medicine

## 2021-05-12 DIAGNOSIS — E032 Hypothyroidism due to medicaments and other exogenous substances: Secondary | ICD-10-CM

## 2021-05-12 DIAGNOSIS — F419 Anxiety disorder, unspecified: Secondary | ICD-10-CM

## 2021-05-12 MED ORDER — LEVOTHYROXINE SODIUM 75 MCG PO TABS: 75.0000 ug | ORAL_TABLET | Freq: Every day | ORAL | 1 refills | Status: DC

## 2021-05-12 MED ORDER — DIAZEPAM 5 MG PO TABS: ORAL_TABLET | ORAL | 1 refills | Status: DC

## 2021-05-29 ENCOUNTER — Other Ambulatory Visit: Payer: Self-pay

## 2021-05-29 ENCOUNTER — Ambulatory Visit (INDEPENDENT_AMBULATORY_CARE_PROVIDER_SITE_OTHER): Payer: BLUE CROSS/BLUE SHIELD | Admitting: Family Medicine

## 2021-05-29 DIAGNOSIS — E785 Hyperlipidemia, unspecified: Secondary | ICD-10-CM | POA: Insufficient documentation

## 2021-05-29 DIAGNOSIS — E032 Hypothyroidism due to medicaments and other exogenous substances: Secondary | ICD-10-CM

## 2021-05-29 DIAGNOSIS — F419 Anxiety disorder, unspecified: Secondary | ICD-10-CM

## 2021-05-29 MED ORDER — ALPRAZOLAM 1 MG PO TABS: 0.5000 mg | ORAL_TABLET | Freq: Two times a day (BID) | ORAL | 0 refills | Status: DC | PRN

## 2021-05-29 MED ORDER — LEVOTHYROXINE SODIUM 75 MCG PO TABS: 75.0000 ug | ORAL_TABLET | Freq: Every day | ORAL | 1 refills | Status: DC

## 2021-05-29 NOTE — Assessment & Plan Note (Signed)
Stopping Valium.  Starting Xanax.  Advised as needed use.

## 2021-05-29 NOTE — Patient Instructions (Signed)
Medication as prescribed.  Call with concerns.  We will see you next year.  Take care  Dr. Adriana Simas

## 2021-05-29 NOTE — Progress Notes (Signed)
Subjective:  Patient ID: Brad Rosales, male    DOB: 11-Aug-1960  Age: 60 y.o. MRN: [id number]  CC: Chief Complaint  Patient presents with   Hypothyroidism    Follow up Establish care Patient would like to restart xanax to help with sleep    HPI:  60 year old male with hypothyroidism and anxiety presents for follow-up.  Patient's hypothyroidism has been stable.  He is currently out of health insurance and would like to know if I would refill his thyroid medication even though he is not able to get labs at this time.  He has been stable on 75 MCG of Synthroid.  Patient states that he is having ongoing issues with anxiety and in turn sleep.  He states that the Valium he uses infrequently does not seem to help very much.  He would like to discuss restarting Xanax.  He has been on this in the past.  Patient Active Problem List   Diagnosis Date Noted   Hyperlipidemia 05/29/2021   Venous stasis dermatitis of both lower extremities 12/02/2020   Depression 12/02/2020   Anxiety 12/02/2020   Hypothyroidism 09/14/2012   Insomnia 09/12/2012    Social Hx   Social History   Socioeconomic History   Marital status: Married    Spouse name: Not on file   Number of children: 1   Years of education: GED   Highest education level: Not on file  Occupational History   Occupation: Grading  Tobacco Use   Smoking status: Never   Smokeless tobacco: Never  Substance and Sexual Activity   Alcohol use: No   Drug use: No   Sexual activity: Yes  Other Topics Concern   Not on file  Social History Narrative   Lives at home w/ his wife   Writes w/ right hand but uses both hands   Drinks a cup of coffee each morning   Social Determinants of Health   Financial Resource Strain: Not on file  Food Insecurity: Not on file  Transportation Needs: Not on file  Physical Activity: Not on file  Stress: Not on file  Social Connections: Not on file    Review of Systems  Constitutional: Negative.    Psychiatric/Behavioral:  Positive for sleep disturbance. The patient is nervous/anxious.     Objective:  BP 128/86   Pulse 86   Temp 98.2 F (36.8 C) (Oral)   Ht 5\' 11"  (1.803 m)   Wt 202 lb 3.2 oz (91.7 kg)   SpO2 99%   BMI 28.20 kg/m   BP/Weight 05/29/2021 12/02/2020 11/10/2020  Systolic BP 128 110 -  Diastolic BP 86 80 -  Wt. (Lbs) 202.2 198 200  BMI 28.2 27.62 27.89    Physical Exam Constitutional:      General: He is not in acute distress.    Appearance: Normal appearance. He is not ill-appearing.  HENT:     Head: Normocephalic and atraumatic.  Eyes:     General:        Right eye: No discharge.        Left eye: No discharge.     Conjunctiva/sclera: Conjunctivae normal.  Cardiovascular:     Rate and Rhythm: Normal rate and regular rhythm.  Pulmonary:     Effort: Pulmonary effort is normal.     Breath sounds: Normal breath sounds. No wheezing or rales.  Neurological:     Mental Status: He is alert.    Lab Results  Component Value Date   WBC 5.5 08/16/2017  HGB 15.8 08/16/2017   HCT 46.2 08/16/2017   PLT 191 08/16/2017   GLUCOSE 75 09/14/2019   CHOL 198 09/14/2019   TRIG 163 (H) 09/14/2019   HDL 45 09/14/2019   LDLCALC 124 (H) 09/14/2019   ALT 11 09/14/2019   AST 14 09/14/2019   NA 140 09/14/2019   K 3.8 09/14/2019   CL 104 09/14/2019   CREATININE 0.94 09/14/2019   BUN 18 09/14/2019   CO2 24 09/14/2019   TSH 1.980 04/22/2020     Assessment & Plan:   Problem List Items Addressed This Visit       Endocrine   Hypothyroidism    Has been stable.  He does not currently have health insurance and wants to wait on labs.  Refilling his Synthroid.      Relevant Medications   levothyroxine (EUTHYROX) 75 MCG tablet     Other   Anxiety    Stopping Valium.  Starting Xanax.  Advised as needed use.      Relevant Medications   ALPRAZolam (XANAX) 1 MG tablet    Meds ordered this encounter  Medications   levothyroxine (EUTHYROX) 75 MCG tablet     Sig: Take 1 tablet (75 mcg total) by mouth daily.    Dispense:  90 tablet    Refill:  1   ALPRAZolam (XANAX) 1 MG tablet    Sig: Take 0.5-1 tablets (0.5-1 mg total) by mouth 2 (two) times daily as needed for anxiety or sleep.    Dispense:  20 tablet    Refill:  0    Follow-up:  Return for Next year for follow up (when patient gets his insurance).  Everlene Other DO [facility]

## 2021-05-29 NOTE — Assessment & Plan Note (Signed)
Has been stable.  He does not currently have health insurance and wants to wait on labs.  Refilling his Synthroid.

## 2021-09-01 ENCOUNTER — Ambulatory Visit: Payer: Self-pay | Admitting: Family Medicine

## 2021-09-07 ENCOUNTER — Other Ambulatory Visit: Payer: Self-pay

## 2021-09-07 ENCOUNTER — Ambulatory Visit (INDEPENDENT_AMBULATORY_CARE_PROVIDER_SITE_OTHER): Payer: BC Managed Care – PPO | Admitting: Family Medicine

## 2021-09-07 VITALS — BP 123/81 | HR 60 | Temp 97.7°F | Ht 71.0 in | Wt 195.0 lb

## 2021-09-07 DIAGNOSIS — E039 Hypothyroidism, unspecified: Secondary | ICD-10-CM | POA: Diagnosis not present

## 2021-09-07 DIAGNOSIS — E032 Hypothyroidism due to medicaments and other exogenous substances: Secondary | ICD-10-CM

## 2021-09-07 DIAGNOSIS — E782 Mixed hyperlipidemia: Secondary | ICD-10-CM

## 2021-09-07 DIAGNOSIS — Z79899 Other long term (current) drug therapy: Secondary | ICD-10-CM

## 2021-09-07 DIAGNOSIS — Z125 Encounter for screening for malignant neoplasm of prostate: Secondary | ICD-10-CM | POA: Diagnosis not present

## 2021-09-07 DIAGNOSIS — B351 Tinea unguium: Secondary | ICD-10-CM

## 2021-09-07 DIAGNOSIS — Z130 Encounter for screening for diseases of the blood and blood-forming organs and certain disorders involving the immune mechanism: Secondary | ICD-10-CM

## 2021-09-07 DIAGNOSIS — F419 Anxiety disorder, unspecified: Secondary | ICD-10-CM

## 2021-09-07 MED ORDER — DIAZEPAM 5 MG PO TABS: 5.0000 mg | ORAL_TABLET | Freq: Two times a day (BID) | ORAL | 1 refills | Status: DC | PRN

## 2021-09-07 NOTE — Progress Notes (Signed)
? ?Subjective:  ?Patient ID: Brad Rosales, male    DOB: 1960-11-08  Age: 61 y.o. MRN: [id number] ? ?CC: ?Chief Complaint  ?Patient presents with  ? Anxiety  ?  Would like to switch back to diazepam   ? sore lips  ?  Noticed with a prostate supplement  ? Nail Problem  ?  Has an appt today with Dr Nevada Crane  ? ? ?HPI: ? ?61 year male with hypothyroidism, insomnia, depression and anxiety, hyperlipidemia presents for follow-up. ? ?Patient suffers from significant anxiety.  GAD-7 score of 21 today.  At his previous visit, patient wanted to switch from diazepam to alprazolam, and we did so.  Patient states that he now wants to return to diazepam.  He feels like it last longer and works better for him.  He states that he has been on daily medication for depression and anxiety but he did not respond well.  He prefers to just use medication as needed. ? ?Patient now has health insurance.  He is in need of labs.  His thyroid levels have not been checked in quite some time.  He is compliant with Synthroid 75 mcg daily. ? ?Additionally, patient reports that he recently started taking a supplement for prostate health.  He believes that he has caused blistering rash in the inside of his lips.  This is currently improved.  I advised him that he should stop the supplement. ? ?Lastly, patient reports ongoing issues with onychomycosis.  He has been using topical treatment with some improvement but no resolution.  He has an upcoming appointment with dermatology today. ? ?Patient Active Problem List  ? Diagnosis Date Noted  ? Onychomycosis 09/07/2021  ? Hyperlipidemia 05/29/2021  ? Depression 12/02/2020  ? Anxiety 12/02/2020  ? Hypothyroidism 09/14/2012  ? Insomnia 09/12/2012  ? ? ?Social Hx   ?Social History  ? ?Socioeconomic History  ? Marital status: Married  ?  Spouse name: Not on file  ? Number of children: 1  ? Years of education: GED  ? Highest education level: Not on file  ?Occupational History  ? Occupation: Grading  ?Tobacco Use  ?  Smoking status: Never  ? Smokeless tobacco: Never  ?Substance and Sexual Activity  ? Alcohol use: No  ? Drug use: No  ? Sexual activity: Yes  ?Other Topics Concern  ? Not on file  ?Social History Narrative  ? Lives at home w/ his wife  ? Writes w/ right hand but uses both hands  ? Drinks a cup of coffee each morning  ? ?Social Determinants of Health  ? ?Financial Resource Strain: Not on file  ?Food Insecurity: Not on file  ?Transportation Needs: Not on file  ?Physical Activity: Not on file  ?Stress: Not on file  ?Social Connections: Not on file  ? ? ?Review of Systems ?Per HPI ? ?Objective:  ?BP 123/81   Pulse 60   Temp 97.7 ?F (36.5 ?C)   Ht 5' 11"  (1.803 m)   Wt 195 lb (88.5 kg)   SpO2 97%   BMI 27.20 kg/m?  ? ?BP/Weight 09/07/2021 05/29/2021 12/02/2020  ?Systolic BP 433 295 188  ?Diastolic BP 81 86 80  ?Wt. (Lbs) 195 202.2 198  ?BMI 27.2 28.2 27.62  ? ? ?Physical Exam ?Vitals and nursing note reviewed.  ?Constitutional:   ?   General: He is not in acute distress. ?   Appearance: Normal appearance. He is not ill-appearing.  ?HENT:  ?   Head: Normocephalic and atraumatic.  ?  Mouth/Throat:  ?   Pharynx: Oropharynx is clear.  ?Eyes:  ?   General:     ?   Right eye: No discharge.     ?   Left eye: No discharge.  ?   Conjunctiva/sclera: Conjunctivae normal.  ?Cardiovascular:  ?   Rate and Rhythm: Normal rate and regular rhythm.  ?Pulmonary:  ?   Effort: Pulmonary effort is normal.  ?   Breath sounds: Normal breath sounds. No wheezing, rhonchi or rales.  ?Neurological:  ?   Mental Status: He is alert.  ?Psychiatric:     ?   Mood and Affect: Mood normal.     ?   Behavior: Behavior normal.  ? ? ?Lab Results  ?Component Value Date  ? WBC 5.5 08/16/2017  ? HGB 15.8 08/16/2017  ? HCT 46.2 08/16/2017  ? PLT 191 08/16/2017  ? GLUCOSE 75 09/14/2019  ? CHOL 198 09/14/2019  ? TRIG 163 (H) 09/14/2019  ? HDL 45 09/14/2019  ? LDLCALC 124 (H) 09/14/2019  ? ALT 11 09/14/2019  ? AST 14 09/14/2019  ? NA 140 09/14/2019  ? K 3.8  09/14/2019  ? CL 104 09/14/2019  ? CREATININE 0.94 09/14/2019  ? BUN 18 09/14/2019  ? CO2 24 09/14/2019  ? TSH 1.980 04/22/2020  ? ? ? ?Assessment & Plan:  ? ?Problem List Items Addressed This Visit   ? ?  ? Endocrine  ? Hypothyroidism - Primary  ?  Labs ordered.  Continue current dose of Synthroid. ?  ?  ? Relevant Orders  ? TSH  ?  ? Musculoskeletal and Integument  ? Onychomycosis  ?  Offered treatment with systemic antifungal.  Patient states that he will wait until he sees dermatology. ?  ?  ?  ? Other  ? Anxiety  ?  Uncontrolled.  Patient prefers to return back to use of Valium.  Offered daily medication to help depression and anxiety and he declined.  We will switch back to diazepam. ?  ?  ? Relevant Medications  ? diazepam (VALIUM) 5 MG tablet  ? Hyperlipidemia  ? Relevant Orders  ? Lipid panel  ? ?Other Visit Diagnoses   ? ? Screening for prostate cancer      ? Relevant Orders  ? PSA  ? Screening for deficiency anemia      ? Relevant Orders  ? CBC  ? High risk medication use      ? Relevant Orders  ? CBC  ? CMP14+EGFR  ? ?  ? ? ?Meds ordered this encounter  ?Medications  ? diazepam (VALIUM) 5 MG tablet  ?  Sig: Take 1 tablet (5 mg total) by mouth every 12 (twelve) hours as needed for anxiety (Sleep).  ?  Dispense:  30 tablet  ?  Refill:  1  ? ? ?Follow-up:  Return in about 6 months (around 03/10/2022). ? ?Thersa Salt DO ?[place] ? ?

## 2021-09-07 NOTE — Assessment & Plan Note (Signed)
Offered treatment with systemic antifungal.  Patient states that he will wait until he sees dermatology. ?

## 2021-09-07 NOTE — Assessment & Plan Note (Signed)
Uncontrolled.  Patient prefers to return back to use of Valium.  Offered daily medication to help depression and anxiety and he declined.  We will switch back to diazepam. ?

## 2021-09-07 NOTE — Patient Instructions (Signed)
Labs today. ? ?Call if you would like oral treatment for the nail fungus. ? ?I have sent in the Valium. ? ?Take care ? ?Dr. Adriana Simas  ?

## 2021-09-07 NOTE — Assessment & Plan Note (Signed)
Labs ordered. °Continue current dose of Synthroid. °

## 2021-09-08 LAB — CMP14+EGFR
ALT: 20 IU/L (ref 0–44)
AST: 18 IU/L (ref 0–40)
Albumin/Globulin Ratio: 2.4 — ABNORMAL HIGH (ref 1.2–2.2)
Albumin: 4.5 g/dL (ref 3.8–4.8)
Alkaline Phosphatase: 71 IU/L (ref 44–121)
BUN/Creatinine Ratio: 17 (ref 10–24)
BUN: 18 mg/dL (ref 8–27)
Bilirubin Total: 0.8 mg/dL (ref 0.0–1.2)
CO2: 24 mmol/L (ref 20–29)
Calcium: 9.5 mg/dL (ref 8.6–10.2)
Chloride: 103 mmol/L (ref 96–106)
Creatinine, Ser: 1.05 mg/dL (ref 0.76–1.27)
Globulin, Total: 1.9 g/dL (ref 1.5–4.5)
Glucose: 109 mg/dL — ABNORMAL HIGH (ref 70–99)
Potassium: 5 mmol/L (ref 3.5–5.2)
Sodium: 140 mmol/L (ref 134–144)
Total Protein: 6.4 g/dL (ref 6.0–8.5)
eGFR: 81 mL/min/{1.73_m2} (ref >59)

## 2021-09-08 LAB — CBC
Hematocrit: 52.1 % — ABNORMAL HIGH (ref 37.5–51.0)
Hemoglobin: 18 g/dL — ABNORMAL HIGH (ref 13.0–17.7)
MCH: 31.8 pg (ref 26.6–33.0)
MCHC: 34.5 g/dL (ref 31.5–35.7)
MCV: 92 fL (ref 79–97)
Platelets: 264 10*3/uL (ref 150–450)
RBC: 5.66 x10E6/uL (ref 4.14–5.80)
RDW: 12.7 % (ref 11.6–15.4)
WBC: 5.1 10*3/uL (ref 3.4–10.8)

## 2021-09-08 LAB — LIPID PANEL
Chol/HDL Ratio: 4.7 ratio (ref 0.0–5.0)
Cholesterol, Total: 216 mg/dL — ABNORMAL HIGH (ref 100–199)
HDL: 46 mg/dL (ref >39)
LDL Chol Calc (NIH): 153 mg/dL — ABNORMAL HIGH (ref 0–99)
Triglycerides: 93 mg/dL (ref 0–149)
VLDL Cholesterol Cal: 17 mg/dL (ref 5–40)

## 2021-09-08 LAB — TSH: TSH: 1.62 u[IU]/mL (ref 0.450–4.500)

## 2021-09-08 LAB — PSA: Prostate Specific Ag, Serum: 0.7 ng/mL (ref 0.0–4.0)

## 2021-09-11 ENCOUNTER — Other Ambulatory Visit: Payer: Self-pay | Admitting: Family Medicine

## 2021-09-11 MED ORDER — ATORVASTATIN CALCIUM 20 MG PO TABS: 20.0000 mg | ORAL_TABLET | Freq: Every day | ORAL | 3 refills | Status: DC

## 2022-02-02 ENCOUNTER — Ambulatory Visit: Payer: BC Managed Care – PPO | Admitting: Family Medicine

## 2022-02-02 ENCOUNTER — Encounter: Payer: Self-pay | Admitting: Family Medicine

## 2022-02-02 DIAGNOSIS — H6593 Unspecified nonsuppurative otitis media, bilateral: Secondary | ICD-10-CM

## 2022-02-02 DIAGNOSIS — F5101 Primary insomnia: Secondary | ICD-10-CM | POA: Diagnosis not present

## 2022-02-02 MED ORDER — AMOXICILLIN-POT CLAVULANATE 875-125 MG PO TABS: 1.0000 | ORAL_TABLET | Freq: Two times a day (BID) | ORAL | 0 refills | Status: DC

## 2022-02-02 MED ORDER — DIAZEPAM 5 MG PO TABS: 5.0000 mg | ORAL_TABLET | Freq: Two times a day (BID) | ORAL | 3 refills | Status: DC | PRN

## 2022-02-02 NOTE — Patient Instructions (Signed)
Medication as prescribed.  Take care  Dr. Amillia Biffle  

## 2022-02-05 DIAGNOSIS — H6590 Unspecified nonsuppurative otitis media, unspecified ear: Secondary | ICD-10-CM | POA: Insufficient documentation

## 2022-02-05 NOTE — Progress Notes (Signed)
Subjective:  Patient ID: Brad Rosales, male    DOB: 08/23/1960  Age: 61 y.o. MRN: [id number]  CC: Chief Complaint  Patient presents with   Sinusitis    Pt left ear stopped up; right ear some but left ear is worse. Cough, sinus congestion. Pt states he bush hogged a few weeks ago and got a cold afterwards.      HPI:  61 year old male presents for evaluation of the above.  Patient reports that he has not been feeling well for the past 2 weeks.  Has had ongoing cough and sinus congestion.  Has also had bilateral ear discomfort.  He feels like his ears are stopped up.  No chest pain.  No shortness of breath.  No relieving factors.  No other associated symptoms.  No other complaints.  Patient Active Problem List   Diagnosis Date Noted   OME (otitis media with effusion) 02/05/2022   Onychomycosis 09/07/2021   Hyperlipidemia 05/29/2021   Depression 12/02/2020   Anxiety 12/02/2020   Hypothyroidism 09/14/2012   Insomnia 09/12/2012    Social Hx   Social History   Socioeconomic History   Marital status: Married    Spouse name: Not on file   Number of children: 1   Years of education: GED   Highest education level: Not on file  Occupational History   Occupation: Grading  Tobacco Use   Smoking status: Never   Smokeless tobacco: Never  Substance and Sexual Activity   Alcohol use: No   Drug use: No   Sexual activity: Yes  Other Topics Concern   Not on file  Social History Narrative   Lives at home w/ his wife   Writes w/ right hand but uses both hands   Drinks a cup of coffee each morning   Social Determinants of Health   Financial Resource Strain: Not on file  Food Insecurity: Not on file  Transportation Needs: Not on file  Physical Activity: Not on file  Stress: Not on file  Social Connections: Not on file    Review of Systems Per HPI  Objective:  BP 111/74   Pulse 73   Temp 97.8 F (36.6 C)   Wt 203 lb 9.6 oz (92.4 kg)   SpO2 94%   BMI 28.40 kg/m       02/02/2022    3:47 PM 09/07/2021   10:36 AM 05/29/2021   10:49 AM  BP/Weight  Systolic BP 111 123 128  Diastolic BP 74 81 86  Wt. (Lbs) 203.6 195 202.2  BMI 28.4 kg/m2 27.2 kg/m2 28.2 kg/m2    Physical Exam Vitals and nursing note reviewed.  Constitutional:      General: He is not in acute distress.    Appearance: Normal appearance. He is not ill-appearing.  HENT:     Head: Normocephalic and atraumatic.     Ears:     Comments: Middle ear effusions noted bilaterally. Eyes:     General:        Right eye: No discharge.        Left eye: No discharge.     Conjunctiva/sclera: Conjunctivae normal.  Cardiovascular:     Rate and Rhythm: Normal rate and regular rhythm.  Pulmonary:     Effort: Pulmonary effort is normal.     Breath sounds: Normal breath sounds. No wheezing, rhonchi or rales.  Neurological:     Mental Status: He is alert.     Lab Results  Component Value Date   WBC  5.1 09/07/2021   HGB 18.0 (H) 09/07/2021   HCT 52.1 (H) 09/07/2021   PLT 264 09/07/2021   GLUCOSE 109 (H) 09/07/2021   CHOL 216 (H) 09/07/2021   TRIG 93 09/07/2021   HDL 46 09/07/2021   LDLCALC 153 (H) 09/07/2021   ALT 20 09/07/2021   AST 18 09/07/2021   NA 140 09/07/2021   K 5.0 09/07/2021   CL 103 09/07/2021   CREATININE 1.05 09/07/2021   BUN 18 09/07/2021   CO2 24 09/07/2021   TSH 1.620 09/07/2021     Assessment & Plan:   Problem List Items Addressed This Visit       Nervous and Auditory   OME (otitis media with effusion)    Treating with Augmentin.      Relevant Medications   amoxicillin-clavulanate (AUGMENTIN) 875-125 MG tablet     Other   Insomnia    Patient's Valium was refilled.       Meds ordered this encounter  Medications   diazepam (VALIUM) 5 MG tablet    Sig: Take 1 tablet (5 mg total) by mouth every 12 (twelve) hours as needed for anxiety (Sleep).    Dispense:  30 tablet    Refill:  3   amoxicillin-clavulanate (AUGMENTIN) 875-125 MG tablet    Sig: Take 1  tablet by mouth 2 (two) times daily.    Dispense:  20 tablet    Refill:  0   Sherra Kimmons DO [facility]

## 2022-02-05 NOTE — Assessment & Plan Note (Signed)
Patient's Valium was refilled.

## 2022-02-05 NOTE — Assessment & Plan Note (Signed)
Treating with Augmentin. 

## 2022-02-13 ENCOUNTER — Telehealth: Payer: Self-pay

## 2022-02-13 DIAGNOSIS — E032 Hypothyroidism due to medicaments and other exogenous substances: Secondary | ICD-10-CM

## 2022-02-13 MED ORDER — LEVOTHYROXINE SODIUM 75 MCG PO TABS: 75.0000 ug | ORAL_TABLET | Freq: Every day | ORAL | 1 refills | Status: DC

## 2022-02-13 NOTE — Telephone Encounter (Signed)
Refill sent to pharmacy and pt is aware. 

## 2022-02-13 NOTE — Telephone Encounter (Signed)
Encourage patient to contact the pharmacy for refills or they can request refills through [facility]  (Please schedule appointment if patient has not been seen in over a year)    WHAT PHARMACY WOULD THEY LIKE THIS SENT TO: [id number] - [place], [place] - [address]  [address], [place] [address]   MEDICATION NAME & DOSE:levothyroxine (EUTHYROX) 75 MCG tablet   NOTES/COMMENTS FROM PATIENT:     Front office please notify patient: It takes 48-72 hours to process rx refill requests Ask patient to call pharmacy to ensure rx is ready before heading there.

## 2022-04-17 ENCOUNTER — Ambulatory Visit: Payer: BC Managed Care – PPO | Admitting: Nurse Practitioner

## 2022-04-17 VITALS — BP 110/80 | Ht 71.0 in | Wt 200.0 lb

## 2022-04-17 DIAGNOSIS — F419 Anxiety disorder, unspecified: Secondary | ICD-10-CM

## 2022-04-17 DIAGNOSIS — R051 Acute cough: Secondary | ICD-10-CM | POA: Diagnosis not present

## 2022-04-17 DIAGNOSIS — F5101 Primary insomnia: Secondary | ICD-10-CM | POA: Diagnosis not present

## 2022-04-17 MED ORDER — DIAZEPAM 5 MG PO TABS: 5.0000 mg | ORAL_TABLET | Freq: Two times a day (BID) | ORAL | 0 refills | Status: DC | PRN

## 2022-04-17 NOTE — Progress Notes (Unsigned)
   Subjective:    Patient ID: Brad Rosales, male    DOB: [birth year], 61 y.o.   MRN: [id number]  HPI  Patient arrives with ongoing cough since last visit in August  Review of Systems     Objective:   Physical Exam        Assessment & Plan:

## 2022-04-18 ENCOUNTER — Encounter: Payer: Self-pay | Admitting: Nurse Practitioner

## 2022-04-18 ENCOUNTER — Telehealth: Payer: Self-pay | Admitting: Family Medicine

## 2022-04-18 NOTE — Telephone Encounter (Signed)
Pt seen yesterday for cough and states something was going to be sent in but nothing at pharmacy. Pt states he needs an antibiotic or something. Please advise. Thank you

## 2022-04-19 ENCOUNTER — Telehealth: Payer: Self-pay | Admitting: Nurse Practitioner

## 2022-04-19 NOTE — Telephone Encounter (Signed)
Pt contacted. Pt states that he needs an antibiotic. Pt states allergy medications do not work. Pt states that he is getting tired of this office anyway; every since [facility] took over. Pt states that he came in to get something to clear this up. Asked if symptoms were worsening and pt reports they were; advised pt he would need to reseen per protocol. Pt states that he doesn't care about protocol. Pt states that if he has to come back in that is another doctor bill and more time out of work. Spoke with Engineer, civil (consulting) and she took over the call. Pt also suggested that he find another primary care. Pt would not take any of our recommendations.

## 2022-04-19 NOTE — Telephone Encounter (Signed)
Pt contacted. Pt states that he needs an antibiotic. Pt states allergy medications do not work. Pt states that he is getting tired of this office anyway; every since [facility] took over. Pt states that he came in to get something to clear this up. Asked if symptoms were worsening and pt reports they were; advised pt he would need to reseen per protocol. Pt states that he doesn't care about protocol. Pt states that if he has to come back in that is another doctor bill and more time out of work. Spoke with manager Shannon and she took over the call. Pt also suggested that he find another primary care. Pt would not take any of our recommendations.  

## 2022-04-19 NOTE — Telephone Encounter (Signed)
Patient is requesting something for cough to be called into [facility], he was seen on 10/24//23

## 2022-04-23 ENCOUNTER — Ambulatory Visit: Payer: BC Managed Care – PPO | Admitting: Family Medicine

## 2022-04-23 ENCOUNTER — Encounter: Payer: Self-pay | Admitting: Family Medicine

## 2022-04-23 DIAGNOSIS — J40 Bronchitis, not specified as acute or chronic: Secondary | ICD-10-CM | POA: Diagnosis not present

## 2022-04-23 DIAGNOSIS — F419 Anxiety disorder, unspecified: Secondary | ICD-10-CM | POA: Diagnosis not present

## 2022-04-23 MED ORDER — PROMETHAZINE-DM 6.25-15 MG/5ML PO SYRP: 5.0000 mL | ORAL_SOLUTION | Freq: Four times a day (QID) | ORAL | 0 refills | Status: DC | PRN

## 2022-04-23 MED ORDER — DOXYCYCLINE HYCLATE 100 MG PO TABS: 100.0000 mg | ORAL_TABLET | Freq: Two times a day (BID) | ORAL | 0 refills | Status: DC

## 2022-04-23 MED ORDER — DIAZEPAM 5 MG PO TABS: 5.0000 mg | ORAL_TABLET | Freq: Two times a day (BID) | ORAL | 0 refills | Status: DC | PRN

## 2022-04-23 MED ORDER — IPRATROPIUM BROMIDE 0.06 % NA SOLN: 2.0000 | Freq: Four times a day (QID) | NASAL | 0 refills | Status: DC | PRN

## 2022-04-23 NOTE — Patient Instructions (Signed)
Medications as prescribed. ° °Take care ° °Dr. Phyillis Dascoli  °

## 2022-04-24 DIAGNOSIS — J40 Bronchitis, not specified as acute or chronic: Secondary | ICD-10-CM | POA: Insufficient documentation

## 2022-04-24 NOTE — Progress Notes (Signed)
Subjective:  Patient ID: Brad Rosales, male    DOB: 1961-03-27  Age: 61 y.o. MRN: [id number]  CC: Chief Complaint  Patient presents with   Cough    Pt arrives with cough, runny nose, left ear pain and chest hurting. States cold has been going on about 3 months. Pt states he usually needs 2 rounds of antibiotics. Unable to take anything with antihistamines. Pt would like refill on Valium and hard copy of all scripts prescribed today.     HPI:  61 year old male presents for evaluation of the above.  Patient reports that he has had ongoing cough.  He has had some runny nose and some left ear discomfort as well.  He states that the cough is the most troublesome symptom.  He cannot take antihistamines due to issues with urination.  No fever.  No shortness of breath.  Patient states that he needs a refill on his diazepam which she uses for insomnia and anxiety.  Patient Active Problem List   Diagnosis Date Noted   Bronchitis 04/24/2022   Onychomycosis 09/07/2021   Hyperlipidemia 05/29/2021   Depression 12/02/2020   Anxiety 12/02/2020   Hypothyroidism 09/14/2012   Insomnia 09/12/2012    Social Hx   Social History   Socioeconomic History   Marital status: Married    Spouse name: Not on file   Number of children: 1   Years of education: GED   Highest education level: Not on file  Occupational History   Occupation: Grading  Tobacco Use   Smoking status: Never   Smokeless tobacco: Never  Substance and Sexual Activity   Alcohol use: No   Drug use: No   Sexual activity: Yes  Other Topics Concern   Not on file  Social History Narrative   Lives at home w/ his wife   Writes w/ right hand but uses both hands   Drinks a cup of coffee each morning   Social Determinants of Health   Financial Resource Strain: Not on file  Food Insecurity: Not on file  Transportation Needs: Not on file  Physical Activity: Not on file  Stress: Not on file  Social Connections: Not on file     Review of Systems Per HPI  Objective:  BP 110/72   Pulse 64   Temp 97.7 F (36.5 C)   Wt 199 lb 6.4 oz (90.4 kg)   SpO2 90%   BMI 27.81 kg/m      04/23/2022    3:39 PM 04/17/2022    3:40 PM 02/02/2022    3:47 PM  BP/Weight  Systolic BP 110 110 111  Diastolic BP 72 80 74  Wt. (Lbs) 199.4 200 203.6  BMI 27.81 kg/m2 27.89 kg/m2 28.4 kg/m2    Physical Exam Vitals and nursing note reviewed.  Constitutional:      General: He is not in acute distress.    Appearance: Normal appearance.  HENT:     Head: Normocephalic and atraumatic.  Cardiovascular:     Rate and Rhythm: Normal rate and regular rhythm.  Pulmonary:     Effort: Pulmonary effort is normal.     Breath sounds: Normal breath sounds. No wheezing or rales.  Neurological:     Mental Status: He is alert.  Psychiatric:        Mood and Affect: Mood normal.        Behavior: Behavior normal.     Lab Results  Component Value Date   WBC 5.1 09/07/2021   HGB  18.0 (H) 09/07/2021   HCT 52.1 (H) 09/07/2021   PLT 264 09/07/2021   GLUCOSE 109 (H) 09/07/2021   CHOL 216 (H) 09/07/2021   TRIG 93 09/07/2021   HDL 46 09/07/2021   LDLCALC 153 (H) 09/07/2021   ALT 20 09/07/2021   AST 18 09/07/2021   NA 140 09/07/2021   K 5.0 09/07/2021   CL 103 09/07/2021   CREATININE 1.05 09/07/2021   BUN 18 09/07/2021   CO2 24 09/07/2021   TSH 1.620 09/07/2021     Assessment & Plan:   Problem List Items Addressed This Visit       Respiratory   Bronchitis    Treating with doxycycline.  Promethazine DM for cough.  Atrovent nasal spray for rhinitis.        Other   Anxiety    Valium refilled.      Relevant Medications   diazepam (VALIUM) 5 MG tablet    Meds ordered this encounter  Medications   DISCONTD: doxycycline (VIBRA-TABS) 100 MG tablet    Sig: Take 1 tablet (100 mg total) by mouth 2 (two) times daily.    Dispense:  14 tablet    Refill:  0    Patient requested written Rx's to ensure that there was no  issues with electronic prescribing. This is NOT to be fill unless he does not pick up E-Script.   DISCONTD: ipratropium (ATROVENT) 0.06 % nasal spray    Sig: Place 2 sprays into both nostrils 4 (four) times daily as needed for rhinitis.    Dispense:  15 mL    Refill:  0   DISCONTD: promethazine-dextromethorphan (PROMETHAZINE-DM) 6.25-15 MG/5ML syrup    Sig: Take 5 mLs by mouth 4 (four) times daily as needed for cough.    Dispense:  118 mL    Refill:  0   DISCONTD: diazepam (VALIUM) 5 MG tablet    Sig: Take 1 tablet (5 mg total) by mouth every 12 (twelve) hours as needed for anxiety (Sleep).    Dispense:  60 tablet    Refill:  0    Patient requested written Rx's to ensure that there was no issues with electronic prescribing. This is NOT to be fill unless he does not pick up E-Script.   diazepam (VALIUM) 5 MG tablet    Sig: Take 1 tablet (5 mg total) by mouth every 12 (twelve) hours as needed for anxiety (Sleep).    Dispense:  60 tablet    Refill:  0   doxycycline (VIBRA-TABS) 100 MG tablet    Sig: Take 1 tablet (100 mg total) by mouth 2 (two) times daily.    Dispense:  14 tablet    Refill:  0   ipratropium (ATROVENT) 0.06 % nasal spray    Sig: Place 2 sprays into both nostrils 4 (four) times daily as needed for rhinitis.    Dispense:  15 mL    Refill:  0   promethazine-dextromethorphan (PROMETHAZINE-DM) 6.25-15 MG/5ML syrup    Sig: Take 5 mLs by mouth 4 (four) times daily as needed for cough.    Dispense:  118 mL    Refill:  [place]

## 2022-04-24 NOTE — Assessment & Plan Note (Signed)
Treating with doxycycline.  Promethazine DM for cough.  Atrovent nasal spray for rhinitis.

## 2022-04-24 NOTE — Assessment & Plan Note (Signed)
Valium refilled 

## 2022-06-21 ENCOUNTER — Ambulatory Visit: Payer: BC Managed Care – PPO | Admitting: Family Medicine

## 2022-06-21 VITALS — BP 128/82 | HR 73 | Temp 97.5°F | Ht 71.0 in | Wt 206.0 lb

## 2022-06-21 DIAGNOSIS — F419 Anxiety disorder, unspecified: Secondary | ICD-10-CM

## 2022-06-21 DIAGNOSIS — R053 Chronic cough: Secondary | ICD-10-CM

## 2022-06-21 MED ORDER — AMOXICILLIN-POT CLAVULANATE 875-125 MG PO TABS: 1.0000 | ORAL_TABLET | Freq: Two times a day (BID) | ORAL | 0 refills | Status: DC

## 2022-06-21 MED ORDER — DIAZEPAM 5 MG PO TABS: 5.0000 mg | ORAL_TABLET | Freq: Two times a day (BID) | ORAL | 3 refills | Status: DC | PRN

## 2022-06-21 MED ORDER — PREDNISONE 10 MG (21) PO TBPK: ORAL_TABLET | ORAL | 0 refills | Status: DC

## 2022-06-21 NOTE — Assessment & Plan Note (Signed)
Patient adamant that second course of antibiotics often needed.  He seems quite troubled by this cough.  Augmentin and prednisone as prescribed.  If continues to persist will need to see pulmonology.

## 2022-06-21 NOTE — Progress Notes (Signed)
Subjective:  Patient ID: Brad Rosales, male    DOB: Nov 04, 1960  Age: 61 y.o. MRN: [id number]  CC: Chief Complaint  Patient presents with   Cough    2 yrs on and off since covid- some productive, partial wheezing in chest    HPI:  61 year old male presents for evaluation of the above.  Patient treated for bronchitis in October.  Patient reports that he had improvement in his symptoms but continues to have cough.  Cough is nonproductive.  No fever.  No shortness of breath.  Deep breath often triggers the cough.  Patient also reports that he is in need of refill on his Valium that he uses for anxiety.  Patient Active Problem List   Diagnosis Date Noted   Chronic cough 06/21/2022   Onychomycosis 09/07/2021   Hyperlipidemia 05/29/2021   Depression 12/02/2020   Anxiety 12/02/2020   Hypothyroidism 09/14/2012   Insomnia 09/12/2012    Social Hx   Social History   Socioeconomic History   Marital status: Married    Spouse name: Not on file   Number of children: 1   Years of education: GED   Highest education level: Not on file  Occupational History   Occupation: Grading  Tobacco Use   Smoking status: Never   Smokeless tobacco: Never  Substance and Sexual Activity   Alcohol use: No   Drug use: No   Sexual activity: Yes  Other Topics Concern   Not on file  Social History Narrative   Lives at home w/ his wife   Writes w/ right hand but uses both hands   Drinks a cup of coffee each morning   Social Determinants of Health   Financial Resource Strain: Not on file  Food Insecurity: Not on file  Transportation Needs: Not on file  Physical Activity: Not on file  Stress: Not on file  Social Connections: Not on file    Review of Systems Per HPI  Objective:  BP 128/82   Pulse 73   Temp (!) 97.5 F (36.4 C)   Ht 5\' 11"  (1.803 m)   Wt 206 lb (93.4 kg)   SpO2 96%   BMI 28.73 kg/m      06/21/2022   11:37 AM 04/23/2022    3:39 PM 04/17/2022    3:40 PM   BP/Weight  Systolic BP 128 110 110  Diastolic BP 82 72 80  Wt. (Lbs) 206 199.4 200  BMI 28.73 kg/m2 27.81 kg/m2 27.89 kg/m2    Physical Exam Vitals and nursing note reviewed.  Constitutional:      General: He is not in acute distress.    Appearance: Normal appearance.  HENT:     Head: Normocephalic and atraumatic.  Cardiovascular:     Rate and Rhythm: Normal rate and regular rhythm.  Pulmonary:     Effort: Pulmonary effort is normal.     Breath sounds: Normal breath sounds. No wheezing or rales.  Neurological:     Mental Status: He is alert.  Psychiatric:        Mood and Affect: Mood normal.        Behavior: Behavior normal.     Lab Results  Component Value Date   WBC 5.1 09/07/2021   HGB 18.0 (H) 09/07/2021   HCT 52.1 (H) 09/07/2021   PLT 264 09/07/2021   GLUCOSE 109 (H) 09/07/2021   CHOL 216 (H) 09/07/2021   TRIG 93 09/07/2021   HDL 46 09/07/2021   LDLCALC 153 (H) 09/07/2021  ALT 20 09/07/2021   AST 18 09/07/2021   NA 140 09/07/2021   K 5.0 09/07/2021   CL 103 09/07/2021   CREATININE 1.05 09/07/2021   BUN 18 09/07/2021   CO2 24 09/07/2021   TSH 1.620 09/07/2021     Assessment & Plan:   Problem List Items Addressed This Visit       Other   Anxiety   Relevant Medications   diazepam (VALIUM) 5 MG tablet   Chronic cough - Primary    Patient adamant that second course of antibiotics often needed.  He seems quite troubled by this cough.  Augmentin and prednisone as prescribed.  If continues to persist will need to see pulmonology.       Meds ordered this encounter  Medications   diazepam (VALIUM) 5 MG tablet    Sig: Take 1 tablet (5 mg total) by mouth every 12 (twelve) hours as needed for anxiety (Sleep).    Dispense:  60 tablet    Refill:  3   amoxicillin-clavulanate (AUGMENTIN) 875-125 MG tablet    Sig: Take 1 tablet by mouth 2 (two) times daily.    Dispense:  14 tablet    Refill:  0   predniSONE (STERAPRED UNI-PAK 21 TAB) 10 MG (21) TBPK  tablet    Sig: 6 tablets on day 1; decrease by 1 tablet daily until gone.    Dispense:  21 tablet    Refill:  0    Aireona Torelli DO [facility]

## 2022-06-21 NOTE — Patient Instructions (Signed)
Medications as prescribed.  Lots of fluids.  Take care  Dr. Sebastien Jackson  

## 2022-10-04 ENCOUNTER — Other Ambulatory Visit: Payer: Self-pay | Admitting: Family Medicine

## 2022-11-07 ENCOUNTER — Other Ambulatory Visit: Payer: Self-pay | Admitting: Family Medicine

## 2022-11-07 DIAGNOSIS — E032 Hypothyroidism due to medicaments and other exogenous substances: Secondary | ICD-10-CM

## 2022-12-30 ENCOUNTER — Encounter: Payer: Self-pay | Admitting: Emergency Medicine

## 2022-12-30 ENCOUNTER — Ambulatory Visit
Admission: EM | Admit: 2022-12-30 | Discharge: 2022-12-30 | Disposition: A | Discharge status: Home / Self Care | Payer: BC Managed Care – PPO | Attending: Nurse Practitioner | Admitting: Nurse Practitioner

## 2022-12-30 DIAGNOSIS — B029 Zoster without complications: Secondary | ICD-10-CM | POA: Diagnosis not present

## 2022-12-30 MED ORDER — PREDNISONE 20 MG PO TABS: 40.0000 mg | ORAL_TABLET | Freq: Every day | ORAL | 0 refills | Status: AC

## 2022-12-30 MED ORDER — VALACYCLOVIR HCL 1 G PO TABS: 1000.0000 mg | ORAL_TABLET | Freq: Three times a day (TID) | ORAL | 0 refills | Status: DC

## 2022-12-30 MED ORDER — GABAPENTIN 100 MG PO CAPS: 100.0000 mg | ORAL_CAPSULE | Freq: Three times a day (TID) | ORAL | 0 refills | Status: DC

## 2022-12-30 NOTE — ED Triage Notes (Signed)
Rash on stomach and back x 1 week that burns.

## 2022-12-30 NOTE — ED Provider Notes (Signed)
[facility]    CSN: [id number] Arrival date & time: 12/30/22  0802      History   Chief Complaint No chief complaint on file.   HPI Brad Rosales is a 62 y.o. male.   The history is provided by the patient.   The patient presents for complaints of rash to the right chest is been present for the past week.  Patient states that the rash started as blisters.  He states they have since dried.  Patient states that the area where the rash is present is burning, and itching.  He states that the area on his back and chest are sensitive to the feel of clothing.  He states that he also has a burning sensation in his back along with muscle aches, fatigue, and chills.  Patient denies fever, exposure to new soaps, medications, detergents, foods, or plants.  Patient reports that he has a history of chickenpox as a child.  Patient states that he shaved the area on his chest thinking it was chiggers.   Past Medical History:  Diagnosis Date   Allergy    seasonal   Anxiety    ED (erectile dysfunction)    Hypothyroidism    Migraines    Nasal turbinate hypertrophy    Paresthesia 11/24/2015   Sinus trouble    Thyroid disease    hyprothyroid    Patient Active Problem List   Diagnosis Date Noted   Chronic cough 06/21/2022   Onychomycosis 09/07/2021   Hyperlipidemia 05/29/2021   Depression 12/02/2020   Anxiety 12/02/2020   Hypothyroidism 09/14/2012   Insomnia 09/12/2012    Past Surgical History:  Procedure Laterality Date   COLONOSCOPY WITH PROPOFOL N/A 08/23/2017   Procedure: COLONOSCOPY WITH PROPOFOL;  Surgeon: Malissa Hippo, MD;  Location: [facility];  Service: Endoscopy;  Laterality: N/A;  10:30   ESOPHAGEAL DILATION N/A 08/23/2017   Procedure: ESOPHAGEAL DILATION;  Surgeon: Malissa Hippo, MD;  Location: [facility];  Service: Endoscopy;  Laterality: N/A;   ESOPHAGOGASTRODUODENOSCOPY (EGD) WITH PROPOFOL N/A 08/23/2017   Procedure: ESOPHAGOGASTRODUODENOSCOPY (EGD) WITH  PROPOFOL;  Surgeon: Malissa Hippo, MD;  Location: [facility];  Service: Endoscopy;  Laterality: N/A;   HAND SURGERY Bilateral    Right thumb and Left thumb   NOSE SURGERY     scrotal cyst     TURBINATE REDUCTION Bilateral 09/18/2019   Procedure: TURBINATE REDUCTION;  Surgeon: Newman Pies, MD;  Location: [facility];  Service: ENT;  Laterality: Bilateral;       Home Medications    Prior to Admission medications   Medication Sig Start Date End Date Taking? Authorizing Provider  atorvastatin (LIPITOR) 20 MG tablet Take 1 tablet by mouth once daily 10/12/22   Everlene Other G, DO  diazepam (VALIUM) 5 MG tablet Take 1 tablet (5 mg total) by mouth every 12 (twelve) hours as needed for anxiety (Sleep). 06/21/22   Tommie Sams, DO  levothyroxine (SYNTHROID) 75 MCG tablet Take 1 tablet by mouth once daily 11/09/22   Tommie Sams, DO    Family History Family History  Problem Relation Age of Onset   Hypertension Father    Congestive Heart Failure Father    Dementia Mother     Social History Social History   Tobacco Use   Smoking status: Never   Smokeless tobacco: Never  Substance Use Topics   Alcohol use: No   Drug use: No     Allergies  Patient has no known allergies.   Review of Systems Review of Systems Per HPI  Physical Exam Triage Vital Signs ED Triage Vitals  Enc Vitals Group     BP 12/30/22 0806 115/81     Pulse Rate 12/30/22 0806 (!) 49     Resp 12/30/22 0806 18     Temp 12/30/22 0806 97.7 F (36.5 C)     Temp Source 12/30/22 0806 Oral     SpO2 12/30/22 0806 95 %     Weight --      Height --      Head Circumference --      Peak Flow --      Pain Score 12/30/22 0808 10     Pain Loc --      Pain Edu? --      Excl. in GC? --    No data found.  Updated Vital Signs BP 115/81 (BP Location: Right Arm)   Pulse (!) 49   Temp 97.7 F (36.5 C) (Oral)   Resp 18   SpO2 95%   Visual Acuity Right Eye Distance:   Left Eye Distance:    Bilateral Distance:    Right Eye Near:   Left Eye Near:    Bilateral Near:     Physical Exam Vitals and nursing note reviewed.  Constitutional:      General: He is not in acute distress.    Appearance: Normal appearance.  HENT:     Head: Normocephalic.     Nose: Nose normal.     Mouth/Throat:     Mouth: Mucous membranes are moist.  Eyes:     Extraocular Movements: Extraocular movements intact.     Pupils: Pupils are equal, round, and reactive to light.  Cardiovascular:     Rate and Rhythm: Normal rate and regular rhythm.     Pulses: Normal pulses.     Heart sounds: Normal heart sounds.  Pulmonary:     Effort: Pulmonary effort is normal. No respiratory distress.     Breath sounds: Normal breath sounds. No stridor. No wheezing, rhonchi or rales.  Chest:     Chest wall: No tenderness.  Abdominal:     General: Bowel sounds are normal.     Palpations: Abdomen is soft.     Tenderness: There is no abdominal tenderness.  Musculoskeletal:     Cervical back: Normal range of motion.  Lymphadenopathy:     Cervical: No cervical adenopathy.  Skin:    General: Skin is warm and dry.     Findings: Rash present.     Comments: Erythematous patch of vesicles noted along the right chest.  Area is sensitive to palpation.  No oozing, blistering, or fluctuance present.  Neurological:     General: No focal deficit present.     Mental Status: He is alert and oriented to person, place, and time.  Psychiatric:        Mood and Affect: Mood normal.        Behavior: Behavior normal.      UC Treatments / Results  Labs (all labs ordered are listed, but only abnormal results are displayed) Labs Reviewed - No data to display  EKG   Radiology No results found.  Procedures Procedures (including critical care time)  Medications Ordered in UC Medications - No data to display  Initial Impression / Assessment and Plan / UC Course  I have reviewed the triage vital signs and the nursing  notes.  Pertinent labs & imaging  results that were available during my care of the patient were reviewed by me and considered in my medical decision making (see chart for details).  The patient is well-appearing, he is in no acute distress,  Symptoms appear to be consistent with a shingles rash.  Valacyclovir 1 g 3 times daily was prescribed along with prednisone 40 mg to help with inflammation and itching, and gabapentin 100 mg 3 times daily for nerve pain.  Supportive care recommendations were provided and discussed with the patient to include cool compresses to the affected area to help with pain or discomfort, covering the areas if they begin to ooze, and over-the-counter analgesics for pain or discomfort.  Patient was advised to follow-up with his PCP after symptoms improved to schedule for the shingles vaccination.  Discussed PHN with patient and advised that if symptoms do begin, to also follow-up with his primary care physician.  Patient is in agreement with this plan of care and verbalizes understanding.  All questions were answered.  Patient stable for discharge. Final Clinical Impressions(s) / UC Diagnoses   Final diagnoses:  None   Discharge Instructions   None    ED Prescriptions   None    PDMP not reviewed this encounter.   Abran Cantor, NP 12/30/22 [phone]

## 2022-12-30 NOTE — Discharge Instructions (Addendum)
Your symptoms appear to be consistent with shingles. Take medication as prescribed.  Do not take the gabapentin at the same time of your anxiety medication.   May take over-the-counter Tylenol as needed for pain, fever, general discomfort. Increase fluids and allow for plenty of rest. May cover the areas on your chest with cool cloths to help with itching, pain, or discomfort. Do not scratch or irritate the areas while symptoms persist. If the areas begin to ooze or blister, keep them covered. As discussed, after the rash resolves, if you continue to experience a "nerve pain" in the area, please follow-up with your primary care physician for further evaluation. Please follow-up with your PCP to discuss administration of the shingles vaccine. Follow-up as needed.

## 2023-01-20 ENCOUNTER — Other Ambulatory Visit: Payer: Self-pay | Admitting: Family Medicine

## 2023-01-20 DIAGNOSIS — F419 Anxiety disorder, unspecified: Secondary | ICD-10-CM

## 2023-01-31 ENCOUNTER — Encounter: Payer: Self-pay | Admitting: Emergency Medicine

## 2023-01-31 ENCOUNTER — Ambulatory Visit (INDEPENDENT_AMBULATORY_CARE_PROVIDER_SITE_OTHER): Payer: BC Managed Care – PPO

## 2023-01-31 ENCOUNTER — Ambulatory Visit
Admission: EM | Admit: 2023-01-31 | Discharge: 2023-01-31 | Disposition: A | Discharge status: Home / Self Care | Payer: BC Managed Care – PPO | Attending: Nurse Practitioner | Admitting: Nurse Practitioner

## 2023-01-31 DIAGNOSIS — R0602 Shortness of breath: Secondary | ICD-10-CM | POA: Diagnosis not present

## 2023-01-31 DIAGNOSIS — R053 Chronic cough: Secondary | ICD-10-CM | POA: Diagnosis not present

## 2023-01-31 MED ORDER — PREDNISONE 20 MG PO TABS: 40.0000 mg | ORAL_TABLET | Freq: Every day | ORAL | 0 refills | Status: AC

## 2023-01-31 MED ORDER — AMOXICILLIN-POT CLAVULANATE 875-125 MG PO TABS: 1.0000 | ORAL_TABLET | Freq: Two times a day (BID) | ORAL | 0 refills | Status: DC

## 2023-01-31 NOTE — Discharge Instructions (Signed)
The chest x-ray did not show any abnormalities. Take medication as prescribed. Increase fluids and allow for plenty of rest. Recommend over-the-counter cough medicine such as Robitussin or Mucinex as needed. Recommend using a humidifier in your bedroom at nighttime during sleep and sleeping elevated on pillows while cough symptoms persist. Please follow-up with your primary care physician regarding referral to pulmonology for continued cough. Follow-up as needed.

## 2023-01-31 NOTE — ED Triage Notes (Signed)
Productive cough x 3 years.  States cough became worse this week.  States chest hurts when coughing and breathing in.  States at times, his throat is sore, but not right now.

## 2023-01-31 NOTE — ED Provider Notes (Signed)
[facility]    CSN: [id number] Arrival date & time: 01/31/23  0810      History   Chief Complaint No chief complaint on file.   HPI Brad Rosales is a 62 y.o. male.   The history is provided by the patient.   Patient presents with a 3-year history of cough.  Patient states cough has been persistent since he had COVID.  Patient states over the last several days, he has experienced shortness of breath and pain with coughing in his chest.  He denies fever, chills, headache, sore throat, wheezing, difficulty breathing, abdominal pain, nausea, vomiting, or diarrhea.  Review the patient's chart shows he was seen by his PCP for the same or similar symptoms in December 2023.  Per Dr. Patsey Berthold note, patient was to be referred to pulmonology if symptoms did not improve.  Patient states he is scheduled to see Dr. Adriana Simas over the next several weeks.  He states he took NyQuil last evening for his symptoms.  Past Medical History:  Diagnosis Date   Allergy    seasonal   Anxiety    ED (erectile dysfunction)    Hypothyroidism    Migraines    Nasal turbinate hypertrophy    Paresthesia 11/24/2015   Sinus trouble    Thyroid disease    hyprothyroid    Patient Active Problem List   Diagnosis Date Noted   Chronic cough 06/21/2022   Onychomycosis 09/07/2021   Hyperlipidemia 05/29/2021   Depression 12/02/2020   Anxiety 12/02/2020   Hypothyroidism 09/14/2012   Insomnia 09/12/2012    Past Surgical History:  Procedure Laterality Date   COLONOSCOPY WITH PROPOFOL N/A 08/23/2017   Procedure: COLONOSCOPY WITH PROPOFOL;  Surgeon: Malissa Hippo, MD;  Location: [facility];  Service: Endoscopy;  Laterality: N/A;  10:30   ESOPHAGEAL DILATION N/A 08/23/2017   Procedure: ESOPHAGEAL DILATION;  Surgeon: Malissa Hippo, MD;  Location: [facility];  Service: Endoscopy;  Laterality: N/A;   ESOPHAGOGASTRODUODENOSCOPY (EGD) WITH PROPOFOL N/A 08/23/2017   Procedure: ESOPHAGOGASTRODUODENOSCOPY (EGD)  WITH PROPOFOL;  Surgeon: Malissa Hippo, MD;  Location: [facility];  Service: Endoscopy;  Laterality: N/A;   HAND SURGERY Bilateral    Right thumb and Left thumb   NOSE SURGERY     scrotal cyst     TURBINATE REDUCTION Bilateral 09/18/2019   Procedure: TURBINATE REDUCTION;  Surgeon: Newman Pies, MD;  Location: [facility];  Service: ENT;  Laterality: Bilateral;       Home Medications    Prior to Admission medications   Medication Sig Start Date End Date Taking? Authorizing Provider  amoxicillin-clavulanate (AUGMENTIN) 875-125 MG tablet Take 1 tablet by mouth every 12 (twelve) hours. 01/31/23  Yes Janiylah Hannis-Warren, Sadie Haber, NP  predniSONE (DELTASONE) 20 MG tablet Take 2 tablets (40 mg total) by mouth daily with breakfast for 5 days. 01/31/23 02/05/23 Yes Tamryn Popko-Warren, Sadie Haber, NP  atorvastatin (LIPITOR) 20 MG tablet Take 1 tablet by mouth once daily 01/21/23   Cook, Dorie Rank G, DO  diazepam (VALIUM) 5 MG tablet TAKE 1 TABLET BY MOUTH EVERY 12 HOURS AS NEEDED FOR ANXIETY OR SLEEP 01/21/23   Everlene Other G, DO  gabapentin (NEURONTIN) 100 MG capsule Take 1 capsule (100 mg total) by mouth 3 (three) times daily for 10 days. 12/30/22 01/09/23  Deandrea Vanpelt-Warren, Sadie Haber, NP  levothyroxine (SYNTHROID) 75 MCG tablet Take 1 tablet by mouth once daily 11/09/22   Tommie Sams, DO  valACYclovir (VALTREX) 1000 MG  tablet Take 1 tablet (1,000 mg total) by mouth 3 (three) times daily. 12/30/22   Keitra Carusone-Warren, Sadie Haber, NP    Family History Family History  Problem Relation Age of Onset   Hypertension Father    Congestive Heart Failure Father    Dementia Mother     Social History Social History   Tobacco Use   Smoking status: Never   Smokeless tobacco: Never  Substance Use Topics   Alcohol use: No   Drug use: No     Allergies   Patient has no known allergies.   Review of Systems Review of Systems Per HPI  Physical Exam Triage Vital Signs ED Triage Vitals  Encounter Vitals Group      BP 01/31/23 0817 111/75     Systolic BP Percentile --      Diastolic BP Percentile --      Pulse Rate 01/31/23 0817 62     Resp 01/31/23 0817 18     Temp 01/31/23 0817 97.6 F (36.4 C)     Temp Source 01/31/23 0817 Oral     SpO2 01/31/23 0817 93 %     Weight --      Height --      Head Circumference --      Peak Flow --      Pain Score 01/31/23 0819 8     Pain Loc --      Pain Education --      Exclude from Growth Chart --    No data found.  Updated Vital Signs BP 111/75 (BP Location: Right Arm)   Pulse 62   Temp 97.6 F (36.4 C) (Oral)   Resp 18   SpO2 93%   Visual Acuity Right Eye Distance:   Left Eye Distance:   Bilateral Distance:    Right Eye Near:   Left Eye Near:    Bilateral Near:     Physical Exam Vitals and nursing note reviewed.  Constitutional:      General: He is not in acute distress.    Appearance: Normal appearance.  HENT:     Head: Normocephalic.     Right Ear: Tympanic membrane, ear canal and external ear normal.     Left Ear: Tympanic membrane, ear canal and external ear normal.     Nose: Nose normal.     Mouth/Throat:     Mouth: Mucous membranes are moist.     Pharynx: No posterior oropharyngeal erythema.  Eyes:     Extraocular Movements: Extraocular movements intact.     Pupils: Pupils are equal, round, and reactive to light.  Cardiovascular:     Rate and Rhythm: Normal rate and regular rhythm.     Pulses: Normal pulses.     Heart sounds: Normal heart sounds.  Pulmonary:     Effort: Pulmonary effort is normal. No respiratory distress.     Breath sounds: Normal breath sounds. No stridor. No wheezing, rhonchi or rales.  Abdominal:     General: Bowel sounds are normal. There is no distension.     Palpations: Abdomen is soft. There is no mass.     Tenderness: There is no abdominal tenderness. There is no right CVA tenderness, left CVA tenderness, guarding or rebound.     Hernia: No hernia is present.  Musculoskeletal:      Cervical back: Normal range of motion.  Lymphadenopathy:     Cervical: No cervical adenopathy.  Skin:    General: Skin is warm and dry.  Neurological:  General: No focal deficit present.     Mental Status: He is alert and oriented to person, place, and time.  Psychiatric:        Mood and Affect: Mood normal.        Behavior: Behavior normal.      UC Treatments / Results  Labs (all labs ordered are listed, but only abnormal results are displayed) Labs Reviewed - No data to display  EKG   Radiology DG Chest 2 View  Result Date: 01/31/2023 CLINICAL DATA:  Chronic cough.  Shortness of breath. EXAM: CHEST - 2 VIEW COMPARISON:  July 15, 2020. FINDINGS: The heart size and mediastinal contours are within normal limits. Both lungs are clear. The visualized skeletal structures are unremarkable. IMPRESSION: No active cardiopulmonary disease. Electronically Signed   By: Lupita Raider M.D.   On: 01/31/2023 08:49    Procedures Procedures (including critical care time)  Medications Ordered in UC Medications - No data to display  Initial Impression / Assessment and Plan / UC Course  I have reviewed the triage vital signs and the nursing notes.  Pertinent labs & imaging results that were available during my care of the patient were reviewed by me and considered in my medical decision making (see chart for details).  The patient is well-appearing, he is in no acute distress, vital signs are stable.  Chest x-ray is negative for active cardiopulmonary disease.  Cough appears to be chronic in nature per his PCPs report dated December 2023.  Cough has been present since patient was diagnosed with COVID 2 to 3 years ago.  Given the chronicity of the cough, will cover patient with Augmentin 875/125 mg for possible infectious etiology and prednisone 40 mg for the next 5 days.  Supportive care recommendations were provided and discussed with the patient to include staying well-hydrated, using  cough drops as needed, and to follow-up with his PCP as discussed.  Patient was advised to discuss referral to pulmonology with his PCP.  Patient is in agreement with this plan of care and verbalizes understanding.  All questions were answered.  Patient stable for discharge.  Final Clinical Impressions(s) / UC Diagnoses   Final diagnoses:  Chronic cough  Shortness of breath     Discharge Instructions      The chest x-ray did not show any abnormalities. Take medication as prescribed. Increase fluids and allow for plenty of rest. Recommend over-the-counter cough medicine such as Robitussin or Mucinex as needed. Recommend using a humidifier in your bedroom at nighttime during sleep and sleeping elevated on pillows while cough symptoms persist. Please follow-up with your primary care physician regarding referral to pulmonology for continued cough. Follow-up as needed.     ED Prescriptions     Medication Sig Dispense Auth. Provider   amoxicillin-clavulanate (AUGMENTIN) 875-125 MG tablet Take 1 tablet by mouth every 12 (twelve) hours. 14 tablet Janiaya Ryser-Warren, Sadie Haber, NP   predniSONE (DELTASONE) 20 MG tablet Take 2 tablets (40 mg total) by mouth daily with breakfast for 5 days. 10 tablet Kivon Aprea-Warren, Sadie Haber, NP      PDMP not reviewed this encounter.   Abran Cantor, NP 01/31/23 [phone]

## 2023-02-02 ENCOUNTER — Other Ambulatory Visit: Payer: Self-pay | Admitting: Family Medicine

## 2023-02-02 DIAGNOSIS — E032 Hypothyroidism due to medicaments and other exogenous substances: Secondary | ICD-10-CM

## 2023-02-05 ENCOUNTER — Ambulatory Visit: Payer: BC Managed Care – PPO

## 2023-02-12 ENCOUNTER — Ambulatory Visit (INDEPENDENT_AMBULATORY_CARE_PROVIDER_SITE_OTHER): Payer: BC Managed Care – PPO | Admitting: Family Medicine

## 2023-02-12 VITALS — BP 115/80 | HR 74 | Temp 97.9°F | Ht 69.0 in | Wt 198.2 lb

## 2023-02-12 DIAGNOSIS — E782 Mixed hyperlipidemia: Secondary | ICD-10-CM | POA: Diagnosis not present

## 2023-02-12 DIAGNOSIS — E032 Hypothyroidism due to medicaments and other exogenous substances: Secondary | ICD-10-CM | POA: Diagnosis not present

## 2023-02-12 DIAGNOSIS — R7309 Other abnormal glucose: Secondary | ICD-10-CM

## 2023-02-12 DIAGNOSIS — Z0000 Encounter for general adult medical examination without abnormal findings: Secondary | ICD-10-CM | POA: Insufficient documentation

## 2023-02-12 DIAGNOSIS — D582 Other hemoglobinopathies: Secondary | ICD-10-CM | POA: Diagnosis not present

## 2023-02-12 DIAGNOSIS — R053 Chronic cough: Secondary | ICD-10-CM

## 2023-02-12 DIAGNOSIS — Z125 Encounter for screening for malignant neoplasm of prostate: Secondary | ICD-10-CM

## 2023-02-12 DIAGNOSIS — Z0001 Encounter for general adult medical examination with abnormal findings: Secondary | ICD-10-CM

## 2023-02-12 MED ORDER — PROMETHAZINE-DM 6.25-15 MG/5ML PO SYRP: 5.0000 mL | ORAL_SOLUTION | Freq: Four times a day (QID) | ORAL | 0 refills | Status: DC | PRN

## 2023-02-12 MED ORDER — IPRATROPIUM BROMIDE 0.06 % NA SOLN: 2.0000 | Freq: Four times a day (QID) | NASAL | 11 refills | Status: DC | PRN

## 2023-02-12 NOTE — Assessment & Plan Note (Signed)
Recent chest x-ray reviewed.  Chest x-ray clear.  Atrovent nasal spray as prescribed.  Promethazine DM for cough.  Referring to pulmonology.  Suspect upper airway cough syndrome.

## 2023-02-12 NOTE — Assessment & Plan Note (Signed)
Labs today.  Preventative care discussed.

## 2023-02-12 NOTE — Patient Instructions (Signed)
Referral placed to pulmonology.  Labs ordered.  Follow up in 6 months.

## 2023-02-12 NOTE — Progress Notes (Signed)
Subjective:  Patient ID: Brad Rosales, male    DOB: 01-13-1961  Age: 62 y.o. MRN: [id number]  CC: Physical; Chronic cough   HPI:  62 year old male presents for physical.  Patient also has an ongoing issue with chronic cough.  Patient has been treated with antibiotics on multiple occasions for bronchitis.  Continues to have significant cough.  Cough is dry.  I see no indication in treating with additional antibiotic course.  This is obviously a chronic cough which needs further evaluation.  Had a recent negative chest x-ray.  Will discuss this today.  Patient needs labs for physical form for work.  Discussed his preventative health care.  Advised that he can get a tetanus if he desires.  He has gotten 1 dose of shingles vaccine at [facility].  Colonoscopy up-to-date.  Patient Active Problem List   Diagnosis Date Noted   Annual physical exam 02/12/2023   Chronic cough 06/21/2022   Onychomycosis 09/07/2021   Hyperlipidemia 05/29/2021   Depression 12/02/2020   Anxiety 12/02/2020   Hypothyroidism 09/14/2012   Insomnia 09/12/2012    Social Hx   Social History   Socioeconomic History   Marital status: Married    Spouse name: Not on file   Number of children: 1   Years of education: GED   Highest education level: Not on file  Occupational History   Occupation: Grading  Tobacco Use   Smoking status: Never   Smokeless tobacco: Never  Substance and Sexual Activity   Alcohol use: No   Drug use: No   Sexual activity: Yes  Other Topics Concern   Not on file  Social History Narrative   Lives at home w/ his wife   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 (Lbs) 198.2    BMI 29.27 kg/m2      Physical Exam Vitals and nursing note reviewed.  Constitutional:      General: He is not in acute distress.    Appearance: Normal appearance.  HENT:     Head: Normocephalic and atraumatic.     Right Ear: Tympanic membrane normal.     Left Ear: Tympanic membrane normal.     Nose: Nose normal.  Eyes:     General:        Right eye: No discharge.        Left eye: No discharge.     Conjunctiva/sclera: Conjunctivae normal.  Cardiovascular:     Rate and Rhythm: Normal rate and regular rhythm.  Pulmonary:     Effort: Pulmonary effort is normal.     Breath sounds: Normal breath sounds. No wheezing, rhonchi or rales.  Abdominal:     General:  There is no distension.     Palpations: Abdomen is soft.     Tenderness: There is no abdominal tenderness.  Skin:    General: Skin is warm.     Findings: No rash.  Neurological:     Mental Status: He is alert.  Psychiatric:        Behavior: Behavior normal.     Lab Results  Component Value Date   WBC 5.1 09/07/2021   HGB 18.0 (H) 09/07/2021   HCT 52.1 (H) 09/07/2021   PLT 264 09/07/2021   GLUCOSE 109 (H) 09/07/2021   CHOL 216 (H) 09/07/2021   TRIG 93 09/07/2021   HDL 46 09/07/2021   LDLCALC 153 (H) 09/07/2021   ALT 20 09/07/2021   AST 18 09/07/2021   NA 140 09/07/2021   K 5.0 09/07/2021   CL 103 09/07/2021   CREATININE 1.05 09/07/2021   BUN 18 09/07/2021   CO2 24 09/07/2021   TSH 1.620 09/07/2021     Assessment & Plan:   Problem List Items Addressed This Visit       Endocrine   Hypothyroidism   Relevant Orders   TSH     Other   Hyperlipidemia   Relevant Orders   Lipid panel   Chronic cough - Primary    Recent chest x-ray  reviewed.  Chest x-ray clear.  Atrovent nasal spray as prescribed.  Promethazine DM for cough.  Referring to pulmonology.  Suspect upper airway cough syndrome.      Relevant Orders   Ambulatory referral to Pulmonology   Annual physical exam    Labs today.  Preventative care discussed.      Other Visit Diagnoses     Elevated glucose       Relevant Orders   CMP14+EGFR   Hemoglobin A1c   Elevated hemoglobin (HCC)       Relevant Orders   CBC   Prostate cancer screening       Relevant Orders   PSA       Meds ordered this encounter  Medications   ipratropium (ATROVENT) 0.06 % nasal spray    Sig: Place 2 sprays into both nostrils 4 (four) times daily as needed for rhinitis.    Dispense:  15 mL    Refill:  11   promethazine-dextromethorphan (PROMETHAZINE-DM) 6.25-15 MG/5ML syrup    Sig: Take 5 mLs by mouth 4 (four) times daily as needed.    Dispense:  118 mL    Refill:  0    Follow-up:  Return in about 6 months (around 08/15/2023).  Everlene Other DO [facility]

## 2023-02-13 LAB — CBC
Hematocrit: 47.5 % (ref 37.5–51.0)
Hemoglobin: 16.7 g/dL (ref 13.0–17.7)
MCH: 33 pg (ref 26.6–33.0)
MCHC: 35.2 g/dL (ref 31.5–35.7)
MCV: 94 fL (ref 79–97)
Platelets: 198 10*3/uL (ref 150–450)
RBC: 5.06 x10E6/uL (ref 4.14–5.80)
RDW: 13.1 % (ref 11.6–15.4)
WBC: 6.4 10*3/uL (ref 3.4–10.8)

## 2023-02-13 LAB — CMP14+EGFR
ALT: 18 IU/L (ref 0–44)
AST: 18 IU/L (ref 0–40)
Albumin: 4 g/dL (ref 3.9–4.9)
Alkaline Phosphatase: 78 IU/L (ref 44–121)
BUN/Creatinine Ratio: 17 (ref 10–24)
BUN: 18 mg/dL (ref 8–27)
Bilirubin Total: 0.7 mg/dL (ref 0.0–1.2)
CO2: 25 mmol/L (ref 20–29)
Calcium: 8.7 mg/dL (ref 8.6–10.2)
Chloride: 102 mmol/L (ref 96–106)
Creatinine, Ser: 1.07 mg/dL (ref 0.76–1.27)
Globulin, Total: 2 g/dL (ref 1.5–4.5)
Glucose: 87 mg/dL (ref 70–99)
Potassium: 4 mmol/L (ref 3.5–5.2)
Sodium: 142 mmol/L (ref 134–144)
Total Protein: 6 g/dL (ref 6.0–8.5)
eGFR: 78 mL/min/{1.73_m2} (ref >59)

## 2023-02-13 LAB — LIPID PANEL
Chol/HDL Ratio: 4.6 ratio (ref 0.0–5.0)
Cholesterol, Total: 157 mg/dL (ref 100–199)
HDL: 34 mg/dL — ABNORMAL LOW (ref >39)
LDL Chol Calc (NIH): 100 mg/dL — ABNORMAL HIGH (ref 0–99)
Triglycerides: 124 mg/dL (ref 0–149)
VLDL Cholesterol Cal: 23 mg/dL (ref 5–40)

## 2023-02-13 LAB — HEMOGLOBIN A1C
Est. average glucose Bld gHb Est-mCnc: 114 mg/dL
Hgb A1c MFr Bld: 5.6 % (ref 4.8–5.6)

## 2023-02-13 LAB — PSA: Prostate Specific Ag, Serum: 0.6 ng/mL (ref 0.0–4.0)

## 2023-02-13 LAB — TSH: TSH: 2.69 u[IU]/mL (ref 0.450–4.500)

## 2023-03-17 NOTE — Progress Notes (Unsigned)
Brad Rosales, male    DOB: 08-17-1960    MRN: [id number]   Brief patient profile:  15  yowm  never smoker with "Always problems with sinus and ears" worse in spring / Inabenet  on shots  maybe 10 years some better on and worse off.    Sinus surgery    Pt  referred to pulmonary clinic in [place]  03/18/2023 by Everlene Other  for cough x 3- 4  years p your covid     History of Present Illness  03/18/2023  Pulmonary/ 1st office eval/ Candy Ziegler / [facility]  Chief Complaint  Patient presents with   Cough    Coughing   Dyspnea:  Not limited by breathing from desired activities   Cough: worse with sing/ talk /  wakes up nightly   min mucoid better with phenergan dm  Sleep: flat bed / 2 pillows initially subsides p cough syrups and sev times noct  But only recurs p stirs in am  SABA use: none  02: none    No obvious day to day or daytime pattern/variability or assoc excess/ purulent sputum or mucus plugs or hemoptysis or cp or chest tightness, subjective wheeze or overt sinus or hb symptoms.    Also denies any obvious fluctuation of symptoms with weather or environmental changes or other aggravating or alleviating factors except as outlined above   No unusual exposure hx or h/o childhood pna/ asthma or knowledge of premature birth.  Current Allergies, Complete Past Medical History, Past Surgical History, Family History, and Social History were reviewed in [employer] record.  ROS  The following are not active complaints unless bolded Hoarseness, sore throat, dysphagia= globus, dental problems, itching, sneezing,  nasal congestion or discharge of excess mucus or purulent secretions, ear ache,   fever, chills, sweats, unintended wt loss or wt gain, classically pleuritic or exertional cp,  orthopnea pnd or arm/hand swelling  or leg swelling, presyncope, palpitations, abdominal pain, anorexia, nausea, vomiting, diarrhea  or change in bowel habits or change in bladder  habits, change in stools or change in urine, dysuria, hematuria,  rash, arthralgias, visual complaints, headache, numbness, weakness or ataxia or problems with walking or coordination,  change in mood or  memory.            Outpatient Medications Prior to Visit  Medication Sig Dispense Refill   atorvastatin (LIPITOR) 20 MG tablet Take 1 tablet by mouth once daily 90 tablet 0   diazepam (VALIUM) 5 MG tablet TAKE 1 TABLET BY MOUTH EVERY 12 HOURS AS NEEDED FOR ANXIETY OR SLEEP 60 tablet 0   ipratropium (ATROVENT) 0.06 % nasal spray Place 2 sprays into both nostrils 4 (four) times daily as needed for rhinitis. 15 mL 11   levothyroxine (SYNTHROID) 75 MCG tablet Take 1 tablet by mouth once daily 90 tablet 0   Psyllium (METAMUCIL PO) Take by mouth. Taking 1 pill a day     valACYclovir (VALTREX) 1000 MG tablet Take 1 tablet (1,000 mg total) by mouth 3 (three) times daily. 21 tablet 0   amoxicillin-clavulanate (AUGMENTIN) 875-125 MG tablet Take 1 tablet by mouth every 12 (twelve) hours. (Patient not taking: Reported on 03/18/2023) 14 tablet 0   gabapentin (NEURONTIN) 100 MG capsule Take 1 capsule (100 mg total) by mouth 3 (three) times daily for 10 days. 30 capsule 0   promethazine-dextromethorphan (PROMETHAZINE-DM) 6.25-15 MG/5ML syrup Take 5 mLs by mouth 4 (four) times daily as needed. (Patient not taking:  Reported on 03/18/2023) 118 mL 0   No facility-administered medications prior to visit.    Past Medical History:  Diagnosis Date   Allergy    seasonal   Anxiety    ED (erectile dysfunction)    Hypothyroidism    Migraines    Nasal turbinate hypertrophy    Paresthesia 11/24/2015   Sinus trouble    Thyroid disease    hyprothyroid      Objective:     BP 99/71   Pulse 84   Ht 5\' 9"  (1.753 m)   Wt 201 lb 6.4 oz (91.4 kg)   SpO2 92% Comment: RA  BMI 29.74 kg/m   SpO2: 92 % (RA)   Amb somber wm nad    HEENT : Oropharynx  clear      Nasal turbinates nl    NECK :  without   apparent JVD/ palpable Nodes/TM    LUNGS: no acc muscle use,  Nl contour chest which is clear to A and P bilaterally with cough on insp  maneuvers   CV:  RRR  no s3 or murmur or increase in P2, and no edema   ABD:  soft and nontender with nl inspiratory excursion in the supine position. No bruits or organomegaly appreciated   MS:  Nl gait/ ext warm without deformities Or obvious joint restrictions  calf tenderness, cyanosis or clubbing    SKIN: warm and dry without lesions    NEURO:  alert, approp, nl sensorium with  no motor or cerebellar deficits apparent.        I personally reviewed images and agree with radiology impression as follows:  CXR:   PA and Lateral 01/31/23  No active cardiopulmonary disease.      Assessment   Upper airway cough syndrome Chronic cough p covid around 2021  -  Sinus CT 03/18/2023 - Allergy screen 03/18/2023 >  Eos 0. /  IgE   - cyclical cough rx with gabapenitin maint and phenerganan dm prn   Upper airway cough syndrome (previously labeled PNDS),  is so named because it's frequently impossible to sort out how much is  CR/sinusitis with freq throat clearing (which can be related to primary GERD)   vs  causing  secondary (" extra esophageal")  GERD from wide swings in gastric pressure that occur with throat clearing, often  promoting self use of mint and menthol lozenges that reduce the lower esophageal sphincter tone and exacerbate the problem further in a cyclical fashion.   These are the same pts (now being labeled as having "irritable larynx syndrome" by some cough centers) who not infrequently have a history of having failed to tolerate ace inhibitors,  dry powder inhalers or biphosphonates or report having atypical/extraesophageal reflux symptoms that don't respond to standard doses of PPI  and are easily confused as having aecopd or asthma flares by even experienced allergists/ pulmonologists (myself included).   Of the three most common causes of   Sub-acute / recurrent or chronic cough, only one (GERD)  can actually contribute to/ trigger  the other two (asthma and post nasal drip syndrome)  and perpetuate the cylce of cough.  While not intuitively obvious, many patients with chronic low grade reflux do not cough until there is a primary insult that disturbs the protective epithelial barrier and exposes sensitive nerve endings.   This is typically viral but can due to PNDS and  either may apply here.     >>>The point is that once this occurs,  it is difficult to eliminate the cycle  using anything but a maximally effective acid suppression regimen at least in the short run, accompanied by an appropriate diet to address non acid GERD and control / eliminate the cough itself for at least 3 days acutely with phenergan dm esp at hs and longterm with gabapentin building up to 100 mg qid    Advised: The standardized cough guidelines published in Chest by [place] in 2006 are still the best available and consist of a multiple step process (up to 12!) , not a single office visit,  and are intended  to address this problem logically,  with an alogrithm dependent on response to empiric treatment at  each progressive step  to determine a specific diagnosis with  minimal addtional testing needed. Therefore if adherence is an issue or can't be accurately verified,  it's very unlikely the standard evaluation and treatment will be successful here.    Furthermore, response to therapy (other than acute cough suppression, which should only be used short term with avoidance of narcotic containing cough syrups if possible), can be a gradual process for which the patient is not likely to  perceive immediate benefit.  Unlike going to an eye doctor where the best perscription is almost always the first one and is immediately effective, this is almost never the case in the management of chronic cough syndromes. Therefore the patient needs to commit up front to  consistently adhere to recommendations  for up to 6 weeks of therapy directed at the likely underlying problem(s) before the response can be reasonably evaluated.   So f/u in 4 weeks with all meds in hand using a trust but verify approach to confirm accurate Medication  Reconciliation The principal here is that until we are certain that the  patients are doing what we've asked, it makes no sense to ask them to do more.          Each maintenance medication was reviewed in detail including emphasizing most importantly the difference between maintenance and prns and under what circumstances the prns are to be triggered using an action plan format where appropriate.  Total time for H and P, chart review, counseling,  and generating customized AVS unique to this office visit / same day charting > 60 min for   refractory respiratory  symptoms of uncertain etiology with pt new to me          Sandrea Hughs, MD 03/18/2023

## 2023-03-18 ENCOUNTER — Ambulatory Visit (INDEPENDENT_AMBULATORY_CARE_PROVIDER_SITE_OTHER): Payer: BC Managed Care – PPO | Admitting: Internal Medicine

## 2023-03-18 ENCOUNTER — Encounter: Payer: Self-pay | Admitting: Internal Medicine

## 2023-03-18 VITALS — BP 99/71 | HR 84 | Ht 69.0 in | Wt 201.4 lb

## 2023-03-18 DIAGNOSIS — R058 Other specified cough: Secondary | ICD-10-CM

## 2023-03-18 MED ORDER — GABAPENTIN 100 MG PO CAPS
100.0000 mg | ORAL_CAPSULE | Freq: Four times a day (QID) | ORAL | 2 refills | Status: DC
Start: 2023-03-18 — End: 2023-06-27

## 2023-03-18 MED ORDER — PANTOPRAZOLE SODIUM 40 MG PO TBEC
40.0000 mg | DELAYED_RELEASE_TABLET | Freq: Every day | ORAL | 2 refills | Status: DC
Start: 2023-03-18 — End: 2023-05-14

## 2023-03-18 MED ORDER — PROMETHAZINE-DM 6.25-15 MG/5ML PO SYRP: 5.0000 mL | ORAL_SOLUTION | Freq: Four times a day (QID) | ORAL | 0 refills | Status: DC | PRN

## 2023-03-18 MED ORDER — FAMOTIDINE 20 MG PO TABS
ORAL_TABLET | ORAL | 11 refills | Status: DC
Start: 2023-03-18 — End: 2024-02-12

## 2023-03-18 NOTE — Patient Instructions (Addendum)
My office will be contacting you by phone for referral sinus CT - if you don't hear back from my office within one week please call us back or notify us thru MyChart and we'll address it right away.   GERD (REFLUX)  is an extremely common cause of respiratory symptoms just like yours , many times with no obvious heartburn at all.    It can be treated with medication, but also with lifestyle changes including elevation of the head of your bed (ideally with 6-8inch blocks under the headboard of your bed),  Smoking cessation, avoidance of late meals, excessive alcohol, and avoid fatty foods, chocolate, peppermint, colas, red wine, and acidic juices such as orange juice.  NO MINT OR MENTHOL PRODUCTS SO NO COUGH DROPS  USE SUGARLESS CANDY INSTEAD (Jolley ranchers or Stover's or Life Savers) or even ice chips will also do - the key is to swallow to prevent all throat clearing. NO OIL BASED VITAMINS - use powdered substitutes.  Avoid fish oil when coughing.   Mucinex dm 1200mg  every 12 hours as needed (in pm ok to substitute phenergan DM if needed for sleep)   Gabapentin 100 mg twice daily for one week,  then three times a day for a week, then 4 x daily until return   Pantoprazole (protonix) 40 mg   Take  30-60 min before first meal of the day and Pepcid (famotidine)  20 mg after supper until return to office - this is the best way to tell whether stomach acid is contributing to your problem.    Please schedule a follow up office visit in 4 weeks, sooner if needed  with all medications /inhalers/ solutions in hand so we can verify exactly what you are taking. This includes all medications from all doctors and over the counters

## 2023-03-19 NOTE — Assessment & Plan Note (Addendum)
Chronic cough p covid around 2021  -  Sinus CT 03/18/2023 - Allergy screen 03/18/2023 >  Eos 0. /  IgE   - cyclical cough rx with gabapenitin maint and phenerganan dm prn   Upper airway cough syndrome (previously labeled PNDS),  is so named because it's frequently impossible to sort out how much is  CR/sinusitis with freq throat clearing (which can be related to primary GERD)   vs  causing  secondary (" extra esophageal")  GERD from wide swings in gastric pressure that occur with throat clearing, often  promoting self use of mint and menthol lozenges that reduce the lower esophageal sphincter tone and exacerbate the problem further in a cyclical fashion.   These are the same pts (now being labeled as having "irritable larynx syndrome" by some cough centers) who not infrequently have a history of having failed to tolerate ace inhibitors,  dry powder inhalers or biphosphonates or report having atypical/extraesophageal reflux symptoms that don't respond to standard doses of PPI  and are easily confused as having aecopd or asthma flares by even experienced allergists/ pulmonologists (myself included).   Of the three most common causes of  Sub-acute / recurrent or chronic cough, only one (GERD)  can actually contribute to/ trigger  the other two (asthma and post nasal drip syndrome)  and perpetuate the cylce of cough.  While not intuitively obvious, many patients with chronic low grade reflux do not cough until there is a primary insult that disturbs the protective epithelial barrier and exposes sensitive nerve endings.   This is typically viral but can due to PNDS and  either may apply here.     >>>The point is that once this occurs, it is difficult to eliminate the cycle  using anything but a maximally effective acid suppression regimen at least in the short run, accompanied by an appropriate diet to address non acid GERD and control / eliminate the cough itself for at least 3 days acutely with phenergan dm  esp at hs and longterm with gabapentin building up to 100 mg qid    Advised: The standardized cough guidelines published in Chest by [place] in 2006 are still the best available and consist of a multiple step process (up to 12!) , not a single office visit,  and are intended  to address this problem logically,  with an alogrithm dependent on response to empiric treatment at  each progressive step  to determine a specific diagnosis with  minimal addtional testing needed. Therefore if adherence is an issue or can't be accurately verified,  it's very unlikely the standard evaluation and treatment will be successful here.    Furthermore, response to therapy (other than acute cough suppression, which should only be used short term with avoidance of narcotic containing cough syrups if possible), can be a gradual process for which the patient is not likely to  perceive immediate benefit.  Unlike going to an eye doctor where the best perscription is almost always the first one and is immediately effective, this is almost never the case in the management of chronic cough syndromes. Therefore the patient needs to commit up front to consistently adhere to recommendations  for up to 6 weeks of therapy directed at the likely underlying problem(s) before the response can be reasonably evaluated.   So f/u in 4 weeks with all meds in hand using a trust but verify approach to confirm accurate Medication  Reconciliation The principal here is that until we are certain  that the  patients are doing what we've asked, it makes no sense to ask them to do more.          Each maintenance medication was reviewed in detail including emphasizing most importantly the difference between maintenance and prns and under what circumstances the prns are to be triggered using an action plan format where appropriate.  Total time for H and P, chart review, counseling,  and generating customized AVS unique to this office visit / same  day charting > 60 min for   refractory respiratory  symptoms of uncertain etiology with pt new to me

## 2023-03-20 LAB — CBC WITH DIFFERENTIAL/PLATELET
Basophils Absolute: 0.1 10*3/uL (ref 0.0–0.2)
Basos: 1 %
EOS (ABSOLUTE): 0.6 10*3/uL — ABNORMAL HIGH (ref 0.0–0.4)
Eos: 9 %
Hematocrit: 50.7 % (ref 37.5–51.0)
Hemoglobin: 16.8 g/dL (ref 13.0–17.7)
Immature Grans (Abs): 0 10*3/uL (ref 0.0–0.1)
Immature Granulocytes: 0 %
Lymphocytes Absolute: 1.4 10*3/uL (ref 0.7–3.1)
Lymphs: 20 %
MCH: 32.4 pg (ref 26.6–33.0)
MCHC: 33.1 g/dL (ref 31.5–35.7)
MCV: 98 fL — ABNORMAL HIGH (ref 79–97)
Monocytes Absolute: 0.5 10*3/uL (ref 0.1–0.9)
Monocytes: 8 %
Neutrophils Absolute: 4.4 10*3/uL (ref 1.4–7.0)
Neutrophils: 62 %
Platelets: 217 10*3/uL (ref 150–450)
RBC: 5.18 x10E6/uL (ref 4.14–5.80)
RDW: 12.3 % (ref 11.6–15.4)
WBC: 7.1 10*3/uL (ref 3.4–10.8)

## 2023-03-20 LAB — IGE: IgE (Immunoglobulin E), Serum: 8 IU/mL (ref 6–495)

## 2023-03-21 ENCOUNTER — Telehealth: Payer: Self-pay

## 2023-03-21 NOTE — Telephone Encounter (Signed)
-----   Message from Sandrea Hughs sent at 03/21/2023  6:13 AM EDT ----- Call patient :  Studies are unremarkable, no change in recs

## 2023-03-21 NOTE — Telephone Encounter (Signed)
I left a message for the patient to return my call.

## 2023-04-02 ENCOUNTER — Other Ambulatory Visit: Payer: BC Managed Care – PPO

## 2023-04-04 ENCOUNTER — Encounter: Payer: Self-pay | Admitting: Internal Medicine

## 2023-04-15 ENCOUNTER — Encounter: Payer: Self-pay | Admitting: Internal Medicine

## 2023-04-15 ENCOUNTER — Ambulatory Visit: Payer: BC Managed Care – PPO | Admitting: Internal Medicine

## 2023-04-23 ENCOUNTER — Other Ambulatory Visit: Payer: Self-pay | Admitting: Family Medicine

## 2023-04-23 DIAGNOSIS — F419 Anxiety disorder, unspecified: Secondary | ICD-10-CM

## 2023-04-26 ENCOUNTER — Other Ambulatory Visit: Payer: BC Managed Care – PPO

## 2023-04-27 ENCOUNTER — Other Ambulatory Visit: Payer: Self-pay | Admitting: Family Medicine

## 2023-04-27 DIAGNOSIS — E032 Hypothyroidism due to medicaments and other exogenous substances: Secondary | ICD-10-CM

## 2023-05-14 ENCOUNTER — Encounter: Payer: Self-pay | Admitting: Internal Medicine

## 2023-05-14 ENCOUNTER — Ambulatory Visit (INDEPENDENT_AMBULATORY_CARE_PROVIDER_SITE_OTHER): Payer: BC Managed Care – PPO | Admitting: Internal Medicine

## 2023-05-14 VITALS — BP 105/65 | HR 58 | Ht 69.0 in | Wt 203.0 lb

## 2023-05-14 DIAGNOSIS — R058 Other specified cough: Secondary | ICD-10-CM | POA: Diagnosis not present

## 2023-05-14 DIAGNOSIS — Z8616 Personal history of COVID-19: Secondary | ICD-10-CM

## 2023-05-14 MED ORDER — PROMETHAZINE-DM 6.25-15 MG/5ML PO SYRP: 5.0000 mL | ORAL_SOLUTION | Freq: Four times a day (QID) | ORAL | 0 refills | Status: DC | PRN

## 2023-05-14 MED ORDER — MONTELUKAST SODIUM 10 MG PO TABS
10.0000 mg | ORAL_TABLET | Freq: Every day | ORAL | 11 refills | Status: DC
Start: 2023-05-14 — End: 2023-06-27

## 2023-05-14 MED ORDER — PANTOPRAZOLE SODIUM 40 MG PO TBEC
40.0000 mg | DELAYED_RELEASE_TABLET | Freq: Every day | ORAL | 2 refills | Status: DC
Start: 2023-05-14 — End: 2024-02-12

## 2023-05-14 MED ORDER — METHYLPREDNISOLONE ACETATE 80 MG/ML IJ SUSP
120.0000 mg | Freq: Once | INTRAMUSCULAR | Status: AC
Start: 2023-05-14 — End: 2023-05-14
  Administered 2023-05-14: 120 mg via INTRAMUSCULAR

## 2023-05-14 NOTE — Patient Instructions (Addendum)
Increase gabapentin to 100 mg x 2 each  am and another 2 in evening  For cough > promethazine -DM  2 tsp at bedtime as needed   Add montelukast 10 mg each am   Pantoprazole (protonix) 40 mg   Take  30-60 min before first meal of the day and Pepcid (famotidine)  20 mg after supper until return to office - this is the best way to tell whether stomach acid is contributing to your problem.     Depomedrol 120 mg IM   Please schedule a follow up office visit in 6 weeks, call sooner if needed

## 2023-05-14 NOTE — Progress Notes (Signed)
Brad Rosales, male    DOB: 1961-03-23    MRN: [id number]   Brief patient profile:  89  yowm never smoker with "Always problems with sinus and ears" worse in spring / Inabenet  on shots  x  maybe 10 years some better on and worse off.    Sinus surgery  around  by Te[facility] 09/18/19  helped some and so did shots never completely better    Pt  referred to pulmonary clinic in [facility]  03/18/2023 by Everlene Other  for cough since covid around 2021      History of Present Illness  03/18/2023  Pulmonary/ 1st office eval/ Brad Rosales / [facility] Office  Chief Complaint  Patient presents with   Cough    Coughing   Dyspnea:  Not limited by breathing from desired activities   Cough: worse with sing/ talk /  wakes up nightly   min mucoid better with phenergan dm  Sleep: flat bed / 2 pillows initially subsides p cough syrups and sev times noct  But only recurs p stirs in am  SABA use: none  02: none  Rec My office will be contacting you by phone for referral sinus CT> not done as of 05/14/2023 > pt canceled   GERD diet reviewed, bed blocks rec  Mucinex dm 1200mg  every 12 hours as needed (in pm ok to substitute phenergan DM if needed for sleep)  Gabapentin 100 mg twice daily for one week,  then three times a day for a week, then 4 x daily until return   Protonix) 40 mg   Take  30-60 min before first meal of the day and Pepcid (famotidine)  20 mg after supper until return to office   Please schedule a follow up office visit in 4 weeks, sooner if needed  with all medications /inhalers/ solutions in hand       05/14/2023  f/u ov/[place] office/Brad Rosales re: cough since around 2020 maint on gabapentin 100 mg twice daily did bring meds  Chief Complaint  Patient presents with   Cough  Dyspnea:  Not limited by breathing from desired activities   Cough: sing/ talk / otherwise sporadic, min mucoid production  Sleeping: has to take phenergan dm or coughs / flat bed two pillow  SABA use: none  02: none       No obvious day to day or daytime variability or assoc excess/ purulent sputum or mucus plugs or hemoptysis or cp or chest tightness, subjective wheeze or overt sinus or hb symptoms.    Also denies any obvious fluctuation of symptoms with weather or environmental changes or other aggravating or alleviating factors except as outlined above   No unusual exposure hx or h/o childhood pna/ asthma or knowledge of premature birth.  Current Allergies, Complete Past Medical History, Past Surgical History, Family History, and Social History were reviewed in [employer] record.  ROS  The following are not active complaints unless bolded Hoarseness, sore throat, dysphagia, dental problems, itching, sneezing,  nasal congestion or discharge of excess mucus or purulent secretions, ear ache,   fever, chills, sweats, unintended wt loss or wt gain, classically pleuritic or exertional cp,  orthopnea pnd or arm/hand swelling  or leg swelling, presyncope, palpitations, abdominal pain, anorexia, nausea, vomiting, diarrhea  or change in bowel habits or change in bladder habits, change in stools or change in urine, dysuria, hematuria,  rash, arthralgias, visual complaints, headache, numbness, weakness or ataxia or problems with walking or coordination,  change in mood or  memory.        Current Meds  Medication Sig   atorvastatin (LIPITOR) 20 MG tablet Take 1 tablet by mouth once daily   diazepam (VALIUM) 5 MG tablet TAKE 1 TABLET BY MOUTH EVERY 12 HOURS AS NEEDED FOR ANXIETY OR SLEEP   famotidine (PEPCID) 20 MG tablet One after supper   gabapentin (NEURONTIN) 100 MG capsule Take 1 capsule (100 mg total) by mouth 4 (four) times daily. (Patient taking differently: Take 100 mg by mouth 2 (two) times daily.)   ipratropium (ATROVENT) 0.06 % nasal spray Place 2 sprays into both nostrils 4 (four) times daily as needed for rhinitis.   levothyroxine (SYNTHROID) 75 MCG tablet Take 1 tablet by mouth  once daily   pantoprazole (PROTONIX) 40 MG tablet Take 1 tablet (40 mg total) by mouth daily. Take 30-60 min before first meal of the day   Psyllium (METAMUCIL PO) Take by mouth. Taking 1 pill a day   valACYclovir (VALTREX) 1000 MG tablet Take 1 tablet (1,000 mg total) by mouth 3 (three) times daily.            Past Medical History:  Diagnosis Date   Allergy    seasonal   Anxiety    ED (erectile dysfunction)    Hypothyroidism    Migraines    Nasal turbinate hypertrophy    Paresthesia 11/24/2015   Sinus trouble    Thyroid disease    hyprothyroid      Objective:    Wt Readings from Last 3 Encounters:  05/14/23 203 lb (92.1 kg)  03/18/23 201 lb 6.4 oz (91.4 kg)  02/12/23 198 lb 3.2 oz (89.9 kg)      Vital signs reviewed  05/14/2023  - Note at rest 02 sats  93% on RA   General appearance:    SOMBER amb wm easily confused with details of care        HEENT : Oropharynx  clear      Nasal turbinates nl / ears canals nl   NECK :  without  apparent JVD/ palpable Nodes/TM    LUNGS: no acc muscle use,  Nl contour chest with cough more on exp than inspiration but really not    CV:  RRR  no s3 or murmur or increase in P2, and no edema   ABD:  soft and nontender   MS:  Nl gait/ ext warm without deformities Or obvious joint restrictions  calf tenderness, cyanosis or clubbing    SKIN: warm and dry without lesions    NEURO:  alert, approp, nl sensorium with  no motor or cerebellar deficits apparent.            Assessment

## 2023-05-14 NOTE — Assessment & Plan Note (Addendum)
Chronic cough p covid around 2021  -  Sinus CT 03/18/2023 > declined  - Allergy screen 03/18/2023 >  Eos 0.6 /  IgE  8 - cyclical cough rx with gabapenitin maint and phenerganan dm prn  - 05/14/2023 singulair 10 mg and increase  gabapentin 100 x 2 every 12 hours as needed  - 05/14/2023 refill PHENERGAN DM   Features continue to strongly favor rhinitis/ sinusitis  > UAC over asthma   Upper airway cough syndrome (previously labeled PNDS),  is so named because it's frequently impossible to sort out how much is  CR/sinusitis with freq throat clearing (which can be related to primary GERD)   vs  causing  secondary (" extra esophageal")  GERD from wide swings in gastric pressure that occur with throat clearing, often  promoting self use of mint and menthol lozenges that reduce the lower esophageal sphincter tone and exacerbate the problem further in a cyclical fashion.   These are the same pts (now being labeled as having "irritable larynx syndrome" by some cough centers) who not infrequently have a history of having failed to tolerate ace inhibitors,  dry powder inhalers or biphosphonates or report having atypical/extraesophageal reflux symptoms(ie LPR)  that don't respond to standard doses of PPI  and are easily confused as having aecopd or asthma flares by even experienced allergists/ pulmonologists (myself included).   Rec  As above plus depomedrol 120mg  IM to see if any short term benefit from steroids >>> ie in case of component of Th-2 driven upper or lower airways inflammation (if cough responds short term only to relapse before return while will on full rx for uacs (as above), then  that would point to allergic rhinitis/ asthma or eos bronchitis as alternative dx)   Advised: The standardized cough guidelines published in Chest by [place] in 2006 are still the best available and consist of a multiple step process (up to 12!) , not a single office visit,  and are intended  to address this  problem logically,  with an alogrithm dependent on response to empiric treatment at  each progressive step  to determine a specific diagnosis with  minimal addtional testing needed. Therefore if adherence is an issue or can't be accurately verified,  it's very unlikely the standard evaluation and treatment will be successful here.    Furthermore, response to therapy (other than acute cough suppression, which should only be used short term with avoidance of narcotic containing cough syrups if possible), can be a gradual process for which the patient is not likely to  perceive immediate benefit.  Unlike going to an eye doctor where the best perscription is almost always the first one and is immediately effective, this is almost never the case in the management of chronic cough syndromes. Therefore the patient needs to commit up front to consistently adhere to recommendations  for up to 6 weeks of therapy directed at the likely underlying problem(s) before the response can be reasonably evaluated.   F/u in 6 weeks          Each maintenance medication was reviewed in detail including emphasizing most importantly the difference between maintenance and prns and under what circumstances the prns are to be triggered using an action plan format where appropriate.  Total time for H and P, chart review, counseling,  and generating customized AVS unique to this office visit / same day charting > 30 min for    refractory respiratory  symptoms of uncertain etiology

## 2023-05-28 ENCOUNTER — Other Ambulatory Visit: Payer: Self-pay | Admitting: Family Medicine

## 2023-05-28 DIAGNOSIS — F419 Anxiety disorder, unspecified: Secondary | ICD-10-CM

## 2023-06-26 NOTE — Progress Notes (Signed)
 Brad Rosales, male    DOB: 1960/11/02    MRN: [id number]   Brief patient profile:  23 yowm never smoker with Always problems with sinus and ears worse in spring / Inabenet  on shots  x  maybe 10 years some better on and worse off.    Sinus surgery  around  by Teoh 09/18/19  helped some and so did shots never completely better    Pt  referred to pulmonary clinic in [facility]  03/18/2023 by Jayce Cook  for cough since covid around 2021      History of Present Illness  03/18/2023  Pulmonary/ 1st office eval/ [name] / [facility]  Chief Complaint  Patient presents with   Cough    Coughing   Dyspnea:  Not limited by breathing from desired activities   Cough: worse with sing/ talk /  wakes up nightly   min mucoid better with phenergan  dm  Sleep: flat bed / 2 pillows initially subsides p cough syrups and sev times noct  But only recurs p stirs in am  SABA use: none  02: none  Rec My office will be contacting you by phone for referral sinus CT> not done as of 05/14/2023 > pt canceled   GERD diet reviewed, bed blocks rec  Mucinex  dm 1200mg  every 12 hours as needed (in pm ok to substitute phenergan  DM if needed for sleep)  Gabapentin  100 mg twice daily for one week,  then three times a day for a week, then 4 x daily until return   Protonix ) 40 mg   Take  30-60 min before first meal of the day and Pepcid  (famotidine )  20 mg after supper until return to office   Please schedule a follow up office visit in 4 weeks, sooner if needed  with all medications /inhalers/ solutions in hand       05/14/2023  f/u ov/[name] office/Mariha Sleeper re: cough since around 2020 maint on gabapentin  100 mg twice daily did bring meds  Chief Complaint  Patient presents with   Cough  Dyspnea:  Not limited by breathing from desired activities   Cough: sing/ talk / otherwise sporadic, min mucoid production  Sleeping: has to take phenergan  dm or coughs / flat bed two pillow  SABA use: none  02: none   Rec Increase gabapentin  to 100 mg x 2 each  am and another 2 in evening For cough > promethazine  -DM  2 tsp at bedtime as needed  Add montelukast  10 mg each am  Pantoprazole  (protonix ) 40 mg   Take  30-60 min before first meal of the day and Pepcid  (famotidine )  20 mg after supper until return to office  Depomedrol 120 mg IM   Please schedule a follow up office visit in 6 weeks, call sooner if needed     06/27/2023  6 week f/u ov/[name] office/[name] re: cough x 2020  maint on gerd rx only, never took gabapentin  as above now singulair    Chief Complaint  Patient presents with   Follow-up    Cough is much improved. Still has occ noct cough that he relates to PND. He occ will produce some clear sputum.    Dyspnea:  Not limited by breathing from desired activities   Cough: resolved to his satisfaction  Sleeping: falt bed, 2 pillows s    resp cc  SABA use: none  02: none       No obvious day to day or daytime variability or assoc  excess/ purulent sputum or mucus plugs or hemoptysis or cp or chest tightness, subjective wheeze or overt sinus or hb symptoms.    Also denies any obvious fluctuation of symptoms with weather or environmental changes or other aggravating or alleviating factors except as outlined above   No unusual exposure hx or h/o childhood pna/ asthma or knowledge of premature birth.  Current Allergies, Complete Past Medical History, Past Surgical History, Family History, and Social History were reviewed in [employer] record.  ROS  The following are not active complaints unless bolded Hoarseness, sore throat, dysphagia, dental problems, itching, sneezing,  nasal congestion or discharge of excess mucus or purulent secretions, ear ache,   fever, chills, sweats, unintended wt loss or wt gain, classically pleuritic or exertional cp,  orthopnea pnd or arm/hand swelling  or leg swelling, presyncope, palpitations, abdominal pain, anorexia, nausea, vomiting,  diarrhea  or change in bowel habits or change in bladder habits, change in stools or change in urine, dysuria, hematuria,  rash, arthralgias, visual complaints, headache, numbness, weakness or ataxia or problems with walking or coordination,  change in mood or  memory.        Current Meds  Medication Sig   atorvastatin  (LIPITOR) 20 MG tablet Take 1 tablet by mouth once daily   diazepam  (VALIUM ) 5 MG tablet TAKE 1 TABLET BY MOUTH EVERY 12 HOURS AS NEEDED FOR ANXIETY OR SLEEP   famotidine  (PEPCID ) 20 MG tablet One after supper   ipratropium (ATROVENT ) 0.06 % nasal spray Place 2 sprays into both nostrils 4 (four) times daily as needed for rhinitis.   levothyroxine  (SYNTHROID ) 75 MCG tablet Take 1 tablet by mouth once daily   pantoprazole  (PROTONIX ) 40 MG tablet Take 1 tablet (40 mg total) by mouth daily. Take 30-60 min before first meal of the day   promethazine -dextromethorphan (PROMETHAZINE -DM) 6.25-15 MG/5ML syrup Take 5 mLs by mouth 4 (four) times daily as needed.   Psyllium (METAMUCIL PO) Take by mouth. Taking 1 pill a day            Past Medical History:  Diagnosis Date   Allergy    seasonal   Anxiety    ED (erectile dysfunction)    Hypothyroidism    Migraines    Nasal turbinate hypertrophy    Paresthesia 11/24/2015   Sinus trouble    Thyroid  disease    hyprothyroid      Objective:    Wts  06/27/2023         202    05/14/23 203 lb (92.1 kg)  03/18/23 201 lb 6.4 oz (91.4 kg)  02/12/23 198 lb 3.2 oz (89.9 kg)     Vital signs reviewed  06/27/2023  - Note at rest 02 sats  95% on RA   General appearance:    amb somber soft spoke wm nad   HEENT : Oropharynx  clear      Nasal turbinates nl    NECK :  without  apparent JVD/ palpable Nodes/TM    LUNGS: no acc muscle use,  Nl contour chest which is clear to A and P bilaterally without cough on insp or exp maneuvers   CV:  RRR  no s3 or murmur or increase in P2, and no edema   ABD:  soft and nontender   MS:  Gait nl    ext warm without deformities Or obvious joint restrictions  calf tenderness, cyanosis or clubbing    SKIN: warm and dry without lesions  NEURO:  alert, approp, nl sensorium with  no motor or cerebellar deficits apparent.                Assessment

## 2023-06-27 ENCOUNTER — Encounter: Payer: Self-pay | Admitting: Internal Medicine

## 2023-06-27 ENCOUNTER — Ambulatory Visit: Payer: BC Managed Care – PPO | Admitting: Internal Medicine

## 2023-06-27 VITALS — BP 132/80 | HR 80 | Ht 69.0 in | Wt 202.0 lb

## 2023-06-27 DIAGNOSIS — R058 Other specified cough: Secondary | ICD-10-CM

## 2023-06-27 NOTE — Assessment & Plan Note (Signed)
 Chronic cough p covid around 2021  -  Sinus CT 03/18/2023 > declined  - Allergy screen 03/18/2023 >  Eos 0.6 /  IgE  8 - cyclical cough rx with gabapenitin maint and phenerganan dm prn  - 05/14/2023 singulair  10 mg and gabapentin  100 x 2 every 12 hours as needed  - 05/14/2023 refill PHENERGAN  DM   - 06/27/2023 better to his satisfaction on just gerd rx  so rx x 3 m max rx then taper off on pepcid  20 mg bid > zero and see if flares.  If flares on max rx for gerd only,  then refer to Allergy in [facility]   It's really hard to tell what helps pt with chronic cough when then don't stay on meds long enough to use hindsight to guide future rx but his cough is mild at this point and largely upper airway in nature so pulmonary f/u can be prn          Each maintenance medication was reviewed in detail including emphasizing most importantly the difference between maintenance and prns and under what circumstances the prns are to be triggered using an action plan format where appropriate.  Total time for H and P, chart review, counseling, reviewing hf device(s) and generating customized AVS unique to this office visit / same day charting  > 30 min final summary f/u ov.

## 2023-06-27 NOTE — Patient Instructions (Signed)
 1) stop pantoprazole  after your last refill is complete  2) increase pepcid  to 20 mg one twice daily after meals x one week (after breakfast and supper)  3) after one week if no cough reduce pepcid  to 20 mg after supper x one more week and  then stop pepcid    If worse cough or acid symptoms go back Try prilosec  otc 20mg   Take 30-60 min before first meal of the day and Pepcid  ac (famotidine ) 20 mg one @  bedtime until cough is completely gone for at least a week without the need for cough suppression  If still not bette, see Dr Bluford for pantoprazole  or return here

## 2023-07-07 ENCOUNTER — Other Ambulatory Visit: Payer: Self-pay | Admitting: Family Medicine

## 2023-07-07 DIAGNOSIS — F419 Anxiety disorder, unspecified: Secondary | ICD-10-CM

## 2023-07-21 ENCOUNTER — Ambulatory Visit (INDEPENDENT_AMBULATORY_CARE_PROVIDER_SITE_OTHER): Payer: BC Managed Care – PPO

## 2023-07-21 ENCOUNTER — Ambulatory Visit
Admission: EM | Admit: 2023-07-21 | Discharge: 2023-07-21 | Disposition: A | Discharge status: Home / Self Care | Payer: BC Managed Care – PPO | Attending: Physician Assistant | Admitting: Physician Assistant

## 2023-07-21 DIAGNOSIS — R0989 Other specified symptoms and signs involving the circulatory and respiratory systems: Secondary | ICD-10-CM | POA: Diagnosis not present

## 2023-07-21 DIAGNOSIS — R051 Acute cough: Secondary | ICD-10-CM

## 2023-07-21 DIAGNOSIS — J069 Acute upper respiratory infection, unspecified: Secondary | ICD-10-CM | POA: Diagnosis not present

## 2023-07-21 LAB — POC COVID19/FLU A&B COMBO
Covid Antigen, POC: NEGATIVE
Influenza A Antigen, POC: NEGATIVE
Influenza B Antigen, POC: NEGATIVE

## 2023-07-21 MED ORDER — ALBUTEROL SULFATE HFA 108 (90 BASE) MCG/ACT IN AERS
2.0000 | INHALATION_SPRAY | Freq: Once | RESPIRATORY_TRACT | Status: AC
  Administered 2023-07-21: 2 via RESPIRATORY_TRACT

## 2023-07-21 MED ORDER — PREDNISONE 20 MG PO TABS: 40.0000 mg | ORAL_TABLET | Freq: Every day | ORAL | 0 refills | Status: AC

## 2023-07-21 MED ORDER — FLUTICASONE PROPIONATE 50 MCG/ACT NA SUSP: 1.0000 | Freq: Every day | NASAL | 0 refills | Status: DC

## 2023-07-21 MED ORDER — AEROCHAMBER PLUS FLO-VU LARGE MISC
1.0000 | Freq: Once | Status: AC
  Administered 2023-07-21: 1

## 2023-07-21 MED ORDER — PROMETHAZINE-DM 6.25-15 MG/5ML PO SYRP: 5.0000 mL | ORAL_SOLUTION | Freq: Three times a day (TID) | ORAL | 0 refills | Status: DC | PRN

## 2023-07-21 NOTE — ED Triage Notes (Signed)
Pt reports chest congestion, coughing up yellowish green mucus,  sore  throat x 1 day.

## 2023-07-21 NOTE — Discharge Instructions (Signed)
You were negative for flu and COVID.  I believe that you have a virus that is causing inflammation in your airways.  Continue albuterol every 4-6 hours as needed.  Start prednisone 40 mg for 5 days.  Do not take NSAIDs with this medication including aspirin, ibuprofen/Advil, naproxen/Aleve.  Use fluticasone nasal spray as well as previously prescribed antihistamine such as Zyrtec.  Use Promethazine DM for cough.  This will make you sleepy so do not drive or drink alcohol while taking it.  Follow-up with your primary care next week if your symptoms have not resolved.  If anything worsens you should be seen immediately.

## 2023-07-21 NOTE — ED Provider Notes (Signed)
[facility]    CSN: [id number] Arrival date & time: 07/21/23  0920      History   Chief Complaint No chief complaint on file.   HPI Brad Rosales is a 63 y.o. male.   Patient presents today with a 2-day history of URI symptoms including cough, congestion, sore throat, left ear fullness.  Denies any fever, chest pain, shortness of breath, nausea, vomiting.  He has not been taking any over-the-counter medication for symptom management.  Denies any known sick contacts.  He does have a history of seasonal allergies and has not been taking allergy medication recently.  Denies any recent antibiotics or steroids.  He has had COVID with last episode several years ago.  He does not smoke.  Denies history of asthma or COPD but does have a chronic cough and has used albuterol inhaler in the past who does not have access to this currently.  He is concerned because often when he gets sick he requires antibiotics before resolution of symptoms and is requesting cough medicine and antibiotics today.    Past Medical History:  Diagnosis Date   Allergy    seasonal   Anxiety    ED (erectile dysfunction)    Hypothyroidism    Migraines    Nasal turbinate hypertrophy    Paresthesia 11/24/2015   Sinus trouble    Thyroid disease    hyprothyroid    Patient Active Problem List   Diagnosis Date Noted   Upper airway cough syndrome 03/18/2023   Annual physical exam 02/12/2023   Chronic cough 06/21/2022   Onychomycosis 09/07/2021   Hyperlipidemia 05/29/2021   Depression 12/02/2020   Anxiety 12/02/2020   Hypothyroidism 09/14/2012   Insomnia 09/12/2012    Past Surgical History:  Procedure Laterality Date   COLONOSCOPY WITH PROPOFOL N/A 08/23/2017   Procedure: COLONOSCOPY WITH PROPOFOL;  Surgeon: Malissa Hippo, MD;  Location: [facility];  Service: Endoscopy;  Laterality: N/A;  10:30   ESOPHAGEAL DILATION N/A 08/23/2017   Procedure: ESOPHAGEAL DILATION;  Surgeon: Malissa Hippo, MD;   Location: [facility];  Service: Endoscopy;  Laterality: N/A;   ESOPHAGOGASTRODUODENOSCOPY (EGD) WITH PROPOFOL N/A 08/23/2017   Procedure: ESOPHAGOGASTRODUODENOSCOPY (EGD) WITH PROPOFOL;  Surgeon: Malissa Hippo, MD;  Location: [facility];  Service: Endoscopy;  Laterality: N/A;   HAND SURGERY Bilateral    Right thumb and Left thumb   NOSE SURGERY     scrotal cyst     TURBINATE REDUCTION Bilateral 09/18/2019   Procedure: TURBINATE REDUCTION;  Surgeon: Newman Pies, MD;  Location: [facility];  Service: ENT;  Laterality: Bilateral;       Home Medications    Prior to Admission medications   Medication Sig Start Date End Date Taking? Authorizing Provider  fluticasone (FLONASE) 50 MCG/ACT nasal spray Place 1 spray into both nostrils daily. 07/21/23  Yes May Manrique K, PA-C  predniSONE (DELTASONE) 20 MG tablet Take 2 tablets (40 mg total) by mouth daily for 5 days. 07/21/23 07/26/23 Yes Artemio Dobie, Noberto Retort, PA-C  atorvastatin (LIPITOR) 20 MG tablet Take 1 tablet by mouth once daily 07/08/23   Cook, Maria Antonia G, DO  diazepam (VALIUM) 5 MG tablet TAKE 1 TABLET BY MOUTH EVERY 12 HOURS AS NEEDED FOR ANXIETY OR SLEEP 07/08/23   Tommie Sams, DO  famotidine (PEPCID) 20 MG tablet One after supper 03/18/23   Nyoka Cowden, MD  ipratropium (ATROVENT) 0.06 % nasal spray Place 2 sprays into both nostrils 4 (  four) times daily as needed for rhinitis. 02/12/23   Tommie Sams, DO  levothyroxine (SYNTHROID) 75 MCG tablet Take 1 tablet by mouth once daily 04/29/23   Cook, Jayce G, DO  pantoprazole (PROTONIX) 40 MG tablet Take 1 tablet (40 mg total) by mouth daily. Take 30-60 min before first meal of the day 05/14/23   Nyoka Cowden, MD  promethazine-dextromethorphan (PROMETHAZINE-DM) 6.25-15 MG/5ML syrup Take 5 mLs by mouth 3 (three) times daily as needed. 07/21/23   Electa Sterry, Denny Peon K, PA-C  Psyllium (METAMUCIL PO) Take by mouth. Taking 1 pill a day    [provider]  valACYclovir (VALTREX) 1000 MG  tablet Take 1 tablet (1,000 mg total) by mouth 3 (three) times daily. 12/30/22   Leath-Warren, Sadie Haber, NP    Family History Family History  Problem Relation Age of Onset   Hypertension Father    Congestive Heart Failure Father    Dementia Mother     Social History Social History   Tobacco Use   Smoking status: Never   Smokeless tobacco: Never  Substance Use Topics   Alcohol use: No   Drug use: No     Allergies   Patient has no known allergies.   Review of Systems Review of Systems  Constitutional:  Positive for activity change. Negative for appetite change, fatigue and fever.  HENT:  Positive for congestion, ear pain, postnasal drip and sore throat. Negative for sinus pressure and sneezing.   Respiratory:  Positive for cough. Negative for shortness of breath.   Cardiovascular:  Negative for chest pain.  Gastrointestinal:  Negative for abdominal pain, diarrhea, nausea and vomiting.  Musculoskeletal:  Negative for arthralgias and myalgias.  Neurological:  Negative for dizziness, light-headedness and headaches.     Physical Exam Triage Vital Signs ED Triage Vitals  Encounter Vitals Group     BP 07/21/23 1105 115/81     Systolic BP Percentile --      Diastolic BP Percentile --      Pulse Rate 07/21/23 1105 63     Resp 07/21/23 1105 16     Temp 07/21/23 1105 98.3 F (36.8 C)     Temp Source 07/21/23 1105 Oral     SpO2 07/21/23 1105 93 %     Weight --      Height --      Head Circumference --      Peak Flow --      Pain Score 07/21/23 1108 9     Pain Loc --      Pain Education --      Exclude from Growth Chart --    No data found.  Updated Vital Signs BP 115/81 (BP Location: Right Arm)   Pulse 63   Temp 98.3 F (36.8 C) (Oral)   Resp 16   SpO2 93%   Visual Acuity Right Eye Distance:   Left Eye Distance:   Bilateral Distance:    Right Eye Near:   Left Eye Near:    Bilateral Near:     Physical Exam Vitals reviewed.  Constitutional:       General: He is awake.     Appearance: Normal appearance. He is well-developed. He is not ill-appearing.     Comments: Very pleasant male appears noted age in no acute distress sitting comfortably in exam room  HENT:     Head: Normocephalic and atraumatic.     Right Ear: Ear canal and external ear normal. A middle ear effusion is  present. Tympanic membrane is not erythematous or bulging.     Left Ear: Ear canal and external ear normal. A middle ear effusion is present. Tympanic membrane is not erythematous or bulging.     Nose: Nose normal.     Mouth/Throat:     Pharynx: Uvula midline. No oropharyngeal exudate, posterior oropharyngeal erythema or uvula swelling.  Cardiovascular:     Rate and Rhythm: Normal rate and regular rhythm.     Heart sounds: Normal heart sounds, S1 normal and S2 normal. No murmur heard. Pulmonary:     Effort: Pulmonary effort is normal. No accessory muscle usage or respiratory distress.     Breath sounds: No stridor. Examination of the right-lower field reveals rales. Rales present. No wheezing or rhonchi.  Neurological:     Mental Status: He is alert.  Psychiatric:        Behavior: Behavior is cooperative.      UC Treatments / Results  Labs (all labs ordered are listed, but only abnormal results are displayed) Labs Reviewed  POC COVID19/FLU A&B COMBO    EKG   Radiology No results found.  Procedures Procedures (including critical care time)  Medications Ordered in UC Medications  albuterol (VENTOLIN HFA) 108 (90 Base) MCG/ACT inhaler 2 puff (2 puffs Inhalation Given 07/21/23 1146)  AeroChamber Plus Flo-Vu Large MISC 1 each (1 each Other Given 07/21/23 1146)    Initial Impression / Assessment and Plan / UC Course  I have reviewed the triage vital signs and the nursing notes.  Pertinent labs & imaging results that were available during my care of the patient were reviewed by me and considered in my medical decision making (see chart for  details).     Patient is well-appearing, afebrile, nontoxic, nontachycardic.  No evidence of acute infection on physical exam though would warrant initiation of antibiotics.  COVID and flu testing were negative.  We discussed symptoms are likely viral in nature.  Chest x-ray was obtained which did not have any evidence of cardiopulmonary disease based on my initial reading.  At the time of discharge we were waiting for radiologist over read and we will contact him if this differs and changes our treatment plan.  He was given albuterol in clinic with improvement of symptoms and encouraged to use this every 4-6 hours as needed.  Will also start prednisone 40 mg for 5 days and we discussed that he is not to take NSAIDs with this medicine due to risk of GI bleeding.  Can use over-the-counter medications including Mucinex, Flonase, Tylenol for additional symptom relief.  He was given Promethazine DM for cough.  We discussed that this can be sedating and he is not to drive or drink alcohol taking it.  Recommended follow-up with his primary care if his symptoms are not improving within 5 to 7 days.  If anything worsens he is to be seen immediately.  Strict return precautions given.  Work excuse note provided.  Final Clinical Impressions(s) / UC Diagnoses   Final diagnoses:  Acute cough  Upper respiratory tract infection, unspecified type     Discharge Instructions      You were negative for flu and COVID.  I believe that you have a virus that is causing inflammation in your airways.  Continue albuterol every 4-6 hours as needed.  Start prednisone 40 mg for 5 days.  Do not take NSAIDs with this medication including aspirin, ibuprofen/Advil, naproxen/Aleve.  Use fluticasone nasal spray as well as previously prescribed antihistamine such  as Zyrtec.  Use Promethazine DM for cough.  This will make you sleepy so do not drive or drink alcohol while taking it.  Follow-up with your primary care next week if your  symptoms have not resolved.  If anything worsens you should be seen immediately.     ED Prescriptions     Medication Sig Dispense Auth. Provider   promethazine-dextromethorphan (PROMETHAZINE-DM) 6.25-15 MG/5ML syrup Take 5 mLs by mouth 3 (three) times daily as needed. 240 mL Sherryll Skoczylas K, PA-C   fluticasone (FLONASE) 50 MCG/ACT nasal spray Place 1 spray into both nostrils daily. 16 g Betsi Crespi K, PA-C   predniSONE (DELTASONE) 20 MG tablet Take 2 tablets (40 mg total) by mouth daily for 5 days. 10 tablet Takota Cahalan, Noberto Retort, PA-C      PDMP not reviewed this encounter.   Jeani Hawking, PA-C 07/21/23 1205

## 2023-07-28 ENCOUNTER — Ambulatory Visit
Admission: EM | Admit: 2023-07-28 | Discharge: 2023-07-28 | Disposition: A | Discharge status: Home / Self Care | Payer: BC Managed Care – PPO | Attending: Nurse Practitioner | Admitting: Nurse Practitioner

## 2023-07-28 DIAGNOSIS — H6992 Unspecified Eustachian tube disorder, left ear: Secondary | ICD-10-CM

## 2023-07-28 DIAGNOSIS — J069 Acute upper respiratory infection, unspecified: Secondary | ICD-10-CM

## 2023-07-28 DIAGNOSIS — Z87898 Personal history of other specified conditions: Secondary | ICD-10-CM

## 2023-07-28 DIAGNOSIS — R059 Cough, unspecified: Secondary | ICD-10-CM

## 2023-07-28 MED ORDER — AZITHROMYCIN 250 MG PO TABS: 250.0000 mg | ORAL_TABLET | Freq: Every day | ORAL | 0 refills | Status: DC

## 2023-07-28 MED ORDER — CETIRIZINE HCL 10 MG PO TABS: 10.0000 mg | ORAL_TABLET | Freq: Every day | ORAL | 0 refills | Status: DC

## 2023-07-28 MED ORDER — FLUTICASONE PROPIONATE 50 MCG/ACT NA SUSP: 2.0000 | Freq: Every day | NASAL | 0 refills | Status: DC

## 2023-07-28 NOTE — Discharge Instructions (Signed)
Take medication as prescribed. Increase fluids and allow for plenty of rest. May take over-the-counter Tylenol or ibuprofen as needed for pain, fever, or general discomfort. Apply warm compresses to the left ear as needed for pain or discomfort. Recommend using a humidifier in your bedroom at nighttime during sleep and sleeping elevated on pillows while cough symptoms persist.  If cough symptoms fail to improve, please follow-up with your primary care physician or pulmonologist for further evaluation. Follow-up as needed.

## 2023-07-28 NOTE — ED Triage Notes (Addendum)
Pt reports cough, congestion, and wheezing was seen 07/21/2023 was given promethazine DM prednisone and Flonase nasal spray for sx's. Given albuterol treatment in clinic. Pt states his sx's have not gotten any better and is now left ear discomfort, with what sounds like crackling and ear fullness x 1 week.

## 2023-07-28 NOTE — ED Provider Notes (Signed)
[facility]    CSN: [id number] Arrival date & time: 07/28/23  0801      History   Chief Complaint No chief complaint on file.   HPI Brad Rosales is a 63 y.o. male.   The history is provided by the patient.   Patient presents in follow-up for continued cough and left ear pain.  Patient was seen in this clinic on 07/21/2023, he was diagnosed with an upper respiratory infection.  He was given prednisone, Promethazine DM, and an albuterol inhaler.  Patient returns today stating that his cough is not any better, also states that he now has pain in the left ear.  Denies any fever, chills, headache, runny nose, nasal congestion, wheezing, difficulty breathing, chest pain, abdominal pain, nausea, vomiting, diarrhea, or rash.  Per review of the patient's chart, he does have history of upper airway cough syndrome.  He states that that symptom has since improved after he stopped eating spicy foods.  He states that he did see pulmonology in the past for this condition.  Past Medical History:  Diagnosis Date   Allergy    seasonal   Anxiety    ED (erectile dysfunction)    Hypothyroidism    Migraines    Nasal turbinate hypertrophy    Paresthesia 11/24/2015   Sinus trouble    Thyroid disease    hyprothyroid    Patient Active Problem List   Diagnosis Date Noted   Upper airway cough syndrome 03/18/2023   Annual physical exam 02/12/2023   Chronic cough 06/21/2022   Onychomycosis 09/07/2021   Hyperlipidemia 05/29/2021   Depression 12/02/2020   Anxiety 12/02/2020   Hypothyroidism 09/14/2012   Insomnia 09/12/2012    Past Surgical History:  Procedure Laterality Date   COLONOSCOPY WITH PROPOFOL N/A 08/23/2017   Procedure: COLONOSCOPY WITH PROPOFOL;  Surgeon: Malissa Hippo, MD;  Location: [facility];  Service: Endoscopy;  Laterality: N/A;  10:30   ESOPHAGEAL DILATION N/A 08/23/2017   Procedure: ESOPHAGEAL DILATION;  Surgeon: Malissa Hippo, MD;  Location: [facility];   Service: Endoscopy;  Laterality: N/A;   ESOPHAGOGASTRODUODENOSCOPY (EGD) WITH PROPOFOL N/A 08/23/2017   Procedure: ESOPHAGOGASTRODUODENOSCOPY (EGD) WITH PROPOFOL;  Surgeon: Malissa Hippo, MD;  Location: [facility];  Service: Endoscopy;  Laterality: N/A;   HAND SURGERY Bilateral    Right thumb and Left thumb   NOSE SURGERY     scrotal cyst     TURBINATE REDUCTION Bilateral 09/18/2019   Procedure: TURBINATE REDUCTION;  Surgeon: Newman Pies, MD;  Location: [facility];  Service: ENT;  Laterality: Bilateral;       Home Medications    Prior to Admission medications   Medication Sig Start Date End Date Taking? Authorizing Provider  azithromycin (ZITHROMAX) 250 MG tablet Take 1 tablet (250 mg total) by mouth daily. Take first 2 tablets together, then 1 every day until finished. 07/28/23  Yes Leath-Warren, Sadie Haber, NP  cetirizine (ZYRTEC) 10 MG tablet Take 1 tablet (10 mg total) by mouth daily. 07/28/23  Yes Leath-Warren, Sadie Haber, NP  fluticasone (FLONASE) 50 MCG/ACT nasal spray Place 2 sprays into both nostrils daily. 07/28/23  Yes Leath-Warren, Sadie Haber, NP  atorvastatin (LIPITOR) 20 MG tablet Take 1 tablet by mouth once daily 07/08/23   Cook, [place] G, DO  diazepam (VALIUM) 5 MG tablet TAKE 1 TABLET BY MOUTH EVERY 12 HOURS AS NEEDED FOR ANXIETY OR SLEEP 07/08/23   Everlene Other G, DO  famotidine (PEPCID) 20 MG  tablet One after supper 03/18/23   Nyoka Cowden, MD  ipratropium (ATROVENT) 0.06 % nasal spray Place 2 sprays into both nostrils 4 (four) times daily as needed for rhinitis. 02/12/23   Tommie Sams, DO  levothyroxine (SYNTHROID) 75 MCG tablet Take 1 tablet by mouth once daily 04/29/23   Cook, Jayce G, DO  pantoprazole (PROTONIX) 40 MG tablet Take 1 tablet (40 mg total) by mouth daily. Take 30-60 min before first meal of the day 05/14/23   Nyoka Cowden, MD  promethazine-dextromethorphan (PROMETHAZINE-DM) 6.25-15 MG/5ML syrup Take 5 mLs by mouth 3 (three) times daily as  needed. 07/21/23   Raspet, Denny Peon K, PA-C  Psyllium (METAMUCIL PO) Take by mouth. Taking 1 pill a day    [provider]  valACYclovir (VALTREX) 1000 MG tablet Take 1 tablet (1,000 mg total) by mouth 3 (three) times daily. 12/30/22   Leath-Warren, Sadie Haber, NP    Family History Family History  Problem Relation Age of Onset   Hypertension Father    Congestive Heart Failure Father    Dementia Mother     Social History Social History   Tobacco Use   Smoking status: Never   Smokeless tobacco: Never  Substance Use Topics   Alcohol use: No   Drug use: No     Allergies   Patient has no known allergies.   Review of Systems Review of Systems Per HPI  Physical Exam Triage Vital Signs ED Triage Vitals  Encounter Vitals Group     BP 07/28/23 0823 127/76     Systolic BP Percentile --      Diastolic BP Percentile --      Pulse Rate 07/28/23 0823 74     Resp 07/28/23 0823 18     Temp 07/28/23 0823 98.3 F (36.8 C)     Temp Source 07/28/23 0823 Oral     SpO2 07/28/23 0823 92 %     Weight --      Height --      Head Circumference --      Peak Flow --      Pain Score 07/28/23 0824 0     Pain Loc --      Pain Education --      Exclude from Growth Chart --    No data found.  Updated Vital Signs BP 127/76 (BP Location: Right Arm)   Pulse 74   Temp 98.3 F (36.8 C) (Oral)   Resp 18   SpO2 92%   Visual Acuity Right Eye Distance:   Left Eye Distance:   Bilateral Distance:    Right Eye Near:   Left Eye Near:    Bilateral Near:     Physical Exam Vitals and nursing note reviewed.  Constitutional:      General: He is not in acute distress.    Appearance: Normal appearance.  HENT:     Head: Normocephalic.     Right Ear: Tympanic membrane, ear canal and external ear normal.     Left Ear: Ear canal and external ear normal.     Nose: Nose normal.     Right Turbinates: Enlarged and swollen.     Left Turbinates: Enlarged and swollen.     Right Sinus: No  maxillary sinus tenderness or frontal sinus tenderness.     Left Sinus: No maxillary sinus tenderness or frontal sinus tenderness.     Mouth/Throat:     Lips: Pink.     Mouth: Mucous membranes are  moist.     Pharynx: Oropharynx is clear. Uvula midline. Postnasal drip present. No pharyngeal swelling or posterior oropharyngeal erythema.     Comments: Cobblestoning present to posterior oropharynx  Eyes:     Extraocular Movements: Extraocular movements intact.     Conjunctiva/sclera: Conjunctivae normal.     Pupils: Pupils are equal, round, and reactive to light.  Cardiovascular:     Rate and Rhythm: Normal rate and regular rhythm.     Pulses: Normal pulses.     Heart sounds: Normal heart sounds.  Pulmonary:     Effort: Pulmonary effort is normal. No respiratory distress.     Breath sounds: Normal breath sounds. No stridor. No wheezing, rhonchi or rales.  Abdominal:     General: Bowel sounds are normal.     Palpations: Abdomen is soft.     Tenderness: There is no abdominal tenderness.  Musculoskeletal:     Cervical back: Normal range of motion.  Lymphadenopathy:     Cervical: No cervical adenopathy.  Skin:    General: Skin is warm and dry.  Neurological:     General: No focal deficit present.     Mental Status: He is alert and oriented to person, place, and time.  Psychiatric:        Mood and Affect: Mood normal.        Behavior: Behavior normal.      UC Treatments / Results  Labs (all labs ordered are listed, but only abnormal results are displayed) Labs Reviewed - No data to display  EKG   Radiology No results found.  Procedures Procedures (including critical care time)  Medications Ordered in UC Medications - No data to display  Initial Impression / Assessment and Plan / UC Course  I have reviewed the triage vital signs and the nursing notes.  Pertinent labs & imaging results that were available during my care of the patient were reviewed by me and considered  in my medical decision making (see chart for details).  On exam, lung sounds are clear throughout, room air sats are at 92%.  Patient with continued cough with mild improvement with previous medications prescribed.  Do feel patient may still be experiencing symptoms from his chronic cough; however, will treat empirically with azithromycin 250 mg for the next 5 days.  With regard to his left ear pain, there is a left middle ear effusion.  Will start patient on fluticasone 50 mcg nasal spray and cetirizine 10 mg.  Supportive care recommendations were provided and discussed with the patient to include over-the-counter analgesics, fluids, rest, and use of heat along with use of a humidifier at night.  Discussed indications for follow-up.  Patient was in agreement with this plan of care and verbalizes understanding.  All questions were answered.  Patient stable for discharge.  Final Clinical Impressions(s) / UC Diagnoses   Final diagnoses:  Cough, unspecified type  Acute upper respiratory infection  Dysfunction of left eustachian tube  History of chronic cough     Discharge Instructions      Take medication as prescribed. Increase fluids and allow for plenty of rest. May take over-the-counter Tylenol or ibuprofen as needed for pain, fever, or general discomfort. Apply warm compresses to the left ear as needed for pain or discomfort. Recommend using a humidifier in your bedroom at nighttime during sleep and sleeping elevated on pillows while cough symptoms persist.  If cough symptoms fail to improve, please follow-up with your primary care physician or pulmonologist for further  evaluation. Follow-up as needed.     ED Prescriptions     Medication Sig Dispense Auth. Provider   fluticasone (FLONASE) 50 MCG/ACT nasal spray Place 2 sprays into both nostrils daily. 16 g Leath-Warren, Sadie Haber, NP   cetirizine (ZYRTEC) 10 MG tablet Take 1 tablet (10 mg total) by mouth daily. 30 tablet  Leath-Warren, Sadie Haber, NP   azithromycin (ZITHROMAX) 250 MG tablet Take 1 tablet (250 mg total) by mouth daily. Take first 2 tablets together, then 1 every day until finished. 6 tablet Leath-Warren, Sadie Haber, NP      PDMP not reviewed this encounter.   Abran Cantor, NP 07/28/23 [phone]

## 2023-08-06 ENCOUNTER — Other Ambulatory Visit: Payer: Self-pay | Admitting: Family Medicine

## 2023-08-06 DIAGNOSIS — E032 Hypothyroidism due to medicaments and other exogenous substances: Secondary | ICD-10-CM

## 2023-08-15 ENCOUNTER — Ambulatory Visit: Payer: BC Managed Care – PPO | Admitting: Family Medicine

## 2023-08-15 ENCOUNTER — Encounter: Payer: Self-pay | Admitting: Family Medicine

## 2023-08-15 VITALS — BP 124/78 | HR 101 | Temp 98.2°F | Ht 69.0 in | Wt 206.0 lb

## 2023-08-15 DIAGNOSIS — J329 Chronic sinusitis, unspecified: Secondary | ICD-10-CM | POA: Diagnosis not present

## 2023-08-15 MED ORDER — AMOXICILLIN-POT CLAVULANATE 875-125 MG PO TABS: 1.0000 | ORAL_TABLET | Freq: Two times a day (BID) | ORAL | 0 refills | Status: DC

## 2023-08-15 MED ORDER — BENZONATATE 200 MG PO CAPS: 200.0000 mg | ORAL_CAPSULE | Freq: Three times a day (TID) | ORAL | 0 refills | Status: DC | PRN

## 2023-08-15 NOTE — Patient Instructions (Addendum)
 Medications as directed.  Follow up annually.  Take care  Dr. Adriana Simas

## 2023-08-16 DIAGNOSIS — J329 Chronic sinusitis, unspecified: Secondary | ICD-10-CM | POA: Insufficient documentation

## 2023-08-16 NOTE — Progress Notes (Signed)
 Subjective:  Patient ID: Brad Rosales, male    DOB: 09-Jul-1960  Age: 63 y.o. MRN: [id number]  CC:   Chief Complaint  Patient presents with   Follow-up   Cough    Sometimes productive cough, yellow mucusWas given zpak and cough medication. Completed course of both. Would like another antibiotic due to still being sick.     HPI:  63 year old male presents with the above complaints.  Patient reports continued productive cough.  Has a history of chronic cough but upper airway cough syndrome.  Follows with pulmonology.  Patient also states that he is having runny nose and congestion.  She has had recent visit at urgent care.  Was told that he had fluid in his left ear.  Reports that he is having a lot of sinus issues.  No fever.  Recently been treated with azithromycin.  Patient Active Problem List   Diagnosis Date Noted   Sinusitis 08/16/2023   Upper airway cough syndrome 03/18/2023   Chronic cough 06/21/2022   Onychomycosis 09/07/2021   Hyperlipidemia 05/29/2021   Depression 12/02/2020   Anxiety 12/02/2020   Hypothyroidism 09/14/2012   Insomnia 09/12/2012    Social Hx   Social History   Socioeconomic History   Marital status: Married    Spouse name: Not on file   Number of children: 1   Years of education: GED   Highest education level: Not on file  Occupational History   Occupation: Grading  Tobacco Use   Smoking status: Never   Smokeless tobacco: Never  Substance and Sexual Activity   Alcohol use: No   Drug use: No   Sexual activity: Yes  Other Topics Concern   Not on file  Social History Narrative   Lives at home w/ his wife   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 (Lbs) 206    BMI 30.42 kg/m2      Physical Exam Constitutional:      General: He is not in acute distress.    Appearance: Normal appearance.  HENT:     Head: Normocephalic and atraumatic.     Nose: No rhinorrhea.  Cardiovascular:     Rate and Rhythm: Normal rate and regular rhythm.  Pulmonary:     Effort: Pulmonary effort is normal.     Breath sounds: Normal breath sounds. No wheezing, rhonchi or rales.  Neurological:     Mental Status: He is alert.  Psychiatric:        Mood and Affect: Mood normal.        Behavior: Behavior normal.     Lab Results  Component Value Date   WBC 7.1 03/18/2023   HGB 16.8 03/18/2023  HCT 50.7 03/18/2023   PLT 217 03/18/2023   GLUCOSE 87 02/12/2023   CHOL 157 02/12/2023   TRIG 124 02/12/2023   HDL 34 (L) 02/12/2023   LDLCALC 100 (H) 02/12/2023   ALT 18 02/12/2023   AST 18 02/12/2023   NA 142 02/12/2023   K 4.0 02/12/2023   CL 102 02/12/2023   CREATININE 1.07 02/12/2023   BUN 18 02/12/2023   CO2 25 02/12/2023   TSH 2.690 02/12/2023   HGBA1C 5.6 02/12/2023     Assessment & Plan:  Sinusitis, unspecified chronicity, unspecified location Assessment & Plan: Treating with Augmentin.   Other orders -     Amoxicillin-Pot Clavulanate; Take 1 tablet by mouth 2 (two) times daily.  Dispense: 20 tablet; Refill: 0 -     Benzonatate; Take 1 capsule (200 mg total) by mouth 3 (three) times daily as needed for cough.  Dispense: 30 capsule; Refill: 0    Follow-up: Follow-up annually  Germaine Shenker DO [facility]

## 2023-08-16 NOTE — Assessment & Plan Note (Signed)
 Treating with Augmentin.

## 2023-10-21 ENCOUNTER — Other Ambulatory Visit: Payer: Self-pay

## 2023-10-21 ENCOUNTER — Other Ambulatory Visit: Payer: Self-pay | Admitting: Family Medicine

## 2023-10-21 MED ORDER — ATORVASTATIN CALCIUM 20 MG PO TABS: 20.0000 mg | ORAL_TABLET | Freq: Every day | ORAL | 3 refills | Status: AC

## 2024-02-02 ENCOUNTER — Other Ambulatory Visit: Payer: Self-pay | Admitting: Family Medicine

## 2024-02-02 DIAGNOSIS — F419 Anxiety disorder, unspecified: Secondary | ICD-10-CM

## 2024-02-10 ENCOUNTER — Other Ambulatory Visit: Payer: Self-pay | Admitting: Family Medicine

## 2024-02-10 DIAGNOSIS — E032 Hypothyroidism due to medicaments and other exogenous substances: Secondary | ICD-10-CM

## 2024-02-12 ENCOUNTER — Ambulatory Visit (INDEPENDENT_AMBULATORY_CARE_PROVIDER_SITE_OTHER): Payer: BC Managed Care – PPO | Admitting: Family Medicine

## 2024-02-12 VITALS — BP 106/75 | HR 88 | Temp 97.3°F | Ht 69.0 in | Wt 207.0 lb

## 2024-02-12 DIAGNOSIS — Z125 Encounter for screening for malignant neoplasm of prostate: Secondary | ICD-10-CM

## 2024-02-12 DIAGNOSIS — R058 Other specified cough: Secondary | ICD-10-CM | POA: Diagnosis not present

## 2024-02-12 DIAGNOSIS — Z130 Encounter for screening for diseases of the blood and blood-forming organs and certain disorders involving the immune mechanism: Secondary | ICD-10-CM | POA: Diagnosis not present

## 2024-02-12 DIAGNOSIS — R7309 Other abnormal glucose: Secondary | ICD-10-CM

## 2024-02-12 DIAGNOSIS — Z0000 Encounter for general adult medical examination without abnormal findings: Secondary | ICD-10-CM | POA: Diagnosis not present

## 2024-02-12 DIAGNOSIS — E782 Mixed hyperlipidemia: Secondary | ICD-10-CM

## 2024-02-12 DIAGNOSIS — E032 Hypothyroidism due to medicaments and other exogenous substances: Secondary | ICD-10-CM | POA: Diagnosis not present

## 2024-02-12 MED ORDER — FLUTICASONE PROPIONATE 50 MCG/ACT NA SUSP: 2.0000 | Freq: Every day | NASAL | 0 refills | Status: AC

## 2024-02-12 MED ORDER — PANTOPRAZOLE SODIUM 40 MG PO TBEC
40.0000 mg | DELAYED_RELEASE_TABLET | Freq: Every day | ORAL | 3 refills | Status: AC
Start: 2024-02-12 — End: ?

## 2024-02-12 MED ORDER — LEVOTHYROXINE SODIUM 75 MCG PO TABS: 75.0000 ug | ORAL_TABLET | Freq: Every day | ORAL | 3 refills | Status: AC

## 2024-02-12 NOTE — Patient Instructions (Signed)
 Labs ordered.  Meds sent.  I will fill out physical form.  Take care  Dr. Bluford

## 2024-02-13 ENCOUNTER — Ambulatory Visit: Payer: Self-pay | Admitting: Family Medicine

## 2024-02-13 ENCOUNTER — Encounter: Admitting: Family Medicine

## 2024-02-13 DIAGNOSIS — Z0000 Encounter for general adult medical examination without abnormal findings: Secondary | ICD-10-CM | POA: Insufficient documentation

## 2024-02-13 LAB — CBC
Hematocrit: 51.9 % — ABNORMAL HIGH (ref 37.5–51.0)
Hemoglobin: 17.5 g/dL (ref 13.0–17.7)
MCH: 32.5 pg (ref 26.6–33.0)
MCHC: 33.7 g/dL (ref 31.5–35.7)
MCV: 97 fL (ref 79–97)
Platelets: 204 x10E3/uL (ref 150–450)
RBC: 5.38 x10E6/uL (ref 4.14–5.80)
RDW: 12.8 % (ref 11.6–15.4)
WBC: 7 x10E3/uL (ref 3.4–10.8)

## 2024-02-13 LAB — CMP14+EGFR
ALT: 17 IU/L (ref 0–44)
AST: 21 IU/L (ref 0–40)
Albumin: 4.3 g/dL (ref 3.9–4.9)
Alkaline Phosphatase: 82 IU/L (ref 44–121)
BUN/Creatinine Ratio: 20 (ref 10–24)
BUN: 22 mg/dL (ref 8–27)
Bilirubin Total: 0.6 mg/dL (ref 0.0–1.2)
CO2: 22 mmol/L (ref 20–29)
Calcium: 8.9 mg/dL (ref 8.6–10.2)
Chloride: 104 mmol/L (ref 96–106)
Creatinine, Ser: 1.11 mg/dL (ref 0.76–1.27)
Globulin, Total: 1.9 g/dL (ref 1.5–4.5)
Glucose: 86 mg/dL (ref 70–99)
Potassium: 5 mmol/L (ref 3.5–5.2)
Sodium: 139 mmol/L (ref 134–144)
Total Protein: 6.2 g/dL (ref 6.0–8.5)
eGFR: 75 mL/min/1.73 (ref >59)

## 2024-02-13 LAB — LIPID PANEL
Chol/HDL Ratio: 4.5 ratio (ref 0.0–5.0)
Cholesterol, Total: 158 mg/dL (ref 100–199)
HDL: 35 mg/dL — ABNORMAL LOW (ref >39)
LDL Chol Calc (NIH): 77 mg/dL (ref 0–99)
Triglycerides: 280 mg/dL — ABNORMAL HIGH (ref 0–149)
VLDL Cholesterol Cal: 46 mg/dL — ABNORMAL HIGH (ref 5–40)

## 2024-02-13 LAB — TSH: TSH: 2.44 u[IU]/mL (ref 0.450–4.500)

## 2024-02-13 LAB — PSA: Prostate Specific Ag, Serum: 0.5 ng/mL (ref 0.0–4.0)

## 2024-02-13 NOTE — Assessment & Plan Note (Signed)
 Declines immunizations. Labs today.

## 2024-02-13 NOTE — Progress Notes (Signed)
 Subjective:  Patient ID: Brad Rosales, male    DOB: [birth year]  Age: 63 y.o. MRN: [id number]  CC:  Annual Exam   HPI:  63 year old male presents for an annual exam.  Patient due for immunizations but declines. 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  Dental exam up to date.  Overall feeling well. Denies chest pain, SOB. Continues to have chronic cough.  Needs medication refills today.   Patient Active Problem List   Diagnosis Date Noted   Annual physical exam 02/13/2024   Upper airway cough syndrome 03/18/2023   Chronic cough 06/21/2022   Onychomycosis 09/07/2021   Hyperlipidemia 05/29/2021   Depression 12/02/2020   Anxiety 12/02/2020   Hypothyroidism 09/14/2012   Insomnia 09/12/2012    Social Hx   Social History   Socioeconomic History   Marital status: Married    Spouse name: Not on file   Number of children: 1   Years of education: GED   Highest education level: Not on file  Occupational History   Occupation: Grading  Tobacco Use   Smoking status: Never   Smokeless tobacco: Never  Substance and Sexual Activity   Alcohol use: No   Drug use: No   Sexual activity: Yes  Other Topics Concern   Not on file  Social History Narrative   Lives at home w/ his wife   Writes w/ right hand but uses both hands   Drinks a cup of coffee each morning   Social Drivers of Corporate investment banker Strain: Not on file  Food Insecurity: Not on file  Transportation Needs: Not on file  Physical Activity: Not on file  Stress: Not on file  Social Connections: Not on file    Review of Systems Per HPI  Objective:  BP 106/75   Pulse 88   Temp (!) 97.3 F (36.3 C)   Ht 5' 9 (1.753 m)   Wt 207 lb (93.9 kg)   SpO2 95%   BMI 30.57 kg/m      02/12/2024    3:10 PM 08/15/2023    3:16 PM 07/28/2023    8:23 AM  BP/Weight  Systolic BP 106 124 127  Diastolic BP 75 78 76  Wt. (Lbs) 207 206   BMI 30.57 kg/m2 30.42 kg/m2     Physical Exam Vitals and nursing note  reviewed.  Constitutional:      General: He is not in acute distress.    Appearance: Normal appearance.  HENT:     Head: Normocephalic and atraumatic.  Eyes:     General:        Right eye: No discharge.        Left eye: No discharge.     Conjunctiva/sclera: Conjunctivae normal.  Cardiovascular:     Rate and Rhythm: Normal rate and regular rhythm.  Pulmonary:     Effort: Pulmonary effort is normal.     Breath sounds: Normal breath sounds. No wheezing, rhonchi or rales.  Neurological:     Mental Status: He is alert.  Psychiatric:        Mood and Affect: Mood normal.        Behavior: Behavior normal.     Lab Results  Component Value Date   WBC 7.0 02/12/2024   HGB 17.5 02/12/2024   HCT 51.9 (H) 02/12/2024   PLT 204 02/12/2024   GLUCOSE 86 02/12/2024   CHOL 158 02/12/2024   TRIG 280 (H) 02/12/2024   HDL 35 (L) 02/12/2024  LDLCALC 77 02/12/2024   ALT 17 02/12/2024   AST 21 02/12/2024   NA 139 02/12/2024   K 5.0 02/12/2024   CL 104 02/12/2024   CREATININE 1.11 02/12/2024   BUN 22 02/12/2024   CO2 22 02/12/2024   TSH 2.440 02/12/2024   HGBA1C 5.6 02/12/2023     Assessment & Plan:  Annual physical exam Assessment & Plan: Declines immunizations. Labs today.    Hypothyroidism due to medication -     Levothyroxine  Sodium; Take 1 tablet (75 mcg total) by mouth daily.  Dispense: 90 tablet; Refill: 3 -     TSH  Upper airway cough syndrome -     Pantoprazole  Sodium; Take 1 tablet (40 mg total) by mouth daily.  Dispense: 90 tablet; Refill: 3  Mixed hyperlipidemia -     Lipid panel  Screening for deficiency anemia -     CBC  Prostate cancer screening -     PSA  Elevated glucose -     CMP14+EGFR  Other orders -     Fluticasone  Propionate; Place 2 sprays into both nostrils daily.  Dispense: 16 g; Refill: 0    Follow-up:  Follow up annually  Brad Snell DO [facility]

## 2024-05-03 ENCOUNTER — Other Ambulatory Visit: Payer: Self-pay | Admitting: Family Medicine

## 2024-05-03 DIAGNOSIS — F419 Anxiety disorder, unspecified: Secondary | ICD-10-CM

## 2024-05-04 MED ORDER — DIAZEPAM 5 MG PO TABS: ORAL_TABLET | ORAL | 0 refills | Status: AC

## 2024-05-13 ENCOUNTER — Telehealth: Payer: Self-pay | Admitting: *Deleted

## 2024-05-13 NOTE — Telephone Encounter (Signed)
 Patient would like to switch from Diazepam  to the [facility] Xanax - He said the [facility] xanax  help him sleep better at night and he does better with that versus the Valium - he would like it to replace the other script- [facility] [place]

## 2024-05-14 NOTE — Telephone Encounter (Signed)
 Cook, Jayce G, DO to Me (Selected Message)     05/13/24  9:50 PM I don't recommend this.

## 2024-05-30 ENCOUNTER — Ambulatory Visit
Admission: EM | Admit: 2024-05-30 | Discharge: 2024-05-30 | Disposition: A | Discharge status: Home / Self Care | Attending: Family Medicine | Admitting: Family Medicine

## 2024-05-30 DIAGNOSIS — S61219A Laceration without foreign body of unspecified finger without damage to nail, initial encounter: Secondary | ICD-10-CM | POA: Diagnosis not present

## 2024-05-30 DIAGNOSIS — Z23 Encounter for immunization: Secondary | ICD-10-CM | POA: Diagnosis not present

## 2024-05-30 MED ORDER — TETANUS-DIPHTH-ACELL PERTUSSIS 5-2-15.5 LF-MCG/0.5 IM SUSP
0.5000 mL | Freq: Once | INTRAMUSCULAR | Status: AC
  Administered 2024-05-30: 0.5 mL via INTRAMUSCULAR

## 2024-05-30 NOTE — Discharge Instructions (Addendum)
 We put 3 stitches in each finger today.  He can come back in 7 to 10 days to have these removed. We updated your tetanus shot today. Keep these dressings on for at least 24 hours.  You can change tomorrow and gently clean with normal soap and water .  You can apply antibiotic ointment and redress.  Do this for the next few days.  You can then let to air if not going to be getting the hands dirty.  Keep protected and avoid getting infected. Please follow-up for any immediate concerns

## 2024-05-30 NOTE — ED Triage Notes (Signed)
 Pt reports he cut his right hands finger tips while using a chainsaw about 1 hr ago

## 2024-05-30 NOTE — ED Provider Notes (Signed)
 [facility]    CSN: [id number] Arrival date & time: 05/30/24  1202      History   Chief Complaint No chief complaint on file.   HPI Brad Rosales is a 63 y.o. male.   Patient is a 63 year old male who presents today with multiple lacerations to the right hand.  This occurred earlier prior to arrival when he cut accidentally with a chainsaw.  Slow bleeding on arrival.  He is not any current blood thinners.     Past Medical History:  Diagnosis Date   Allergy    seasonal   Anxiety    ED (erectile dysfunction)    Hypothyroidism    Migraines    Nasal turbinate hypertrophy    Paresthesia 11/24/2015   Sinus trouble    Thyroid  disease    hyprothyroid    Patient Active Problem List   Diagnosis Date Noted   Annual physical exam 02/13/2024   Upper airway cough syndrome 03/18/2023   Chronic cough 06/21/2022   Onychomycosis 09/07/2021   Hyperlipidemia 05/29/2021   Depression 12/02/2020   Anxiety 12/02/2020   Hypothyroidism 09/14/2012   Insomnia 09/12/2012    Past Surgical History:  Procedure Laterality Date   COLONOSCOPY WITH PROPOFOL  N/A 08/23/2017   Procedure: COLONOSCOPY WITH PROPOFOL ;  Surgeon: Golda Claudis PENNER, MD;  Location: [facility];  Service: Endoscopy;  Laterality: N/A;  10:30   ESOPHAGEAL DILATION N/A 08/23/2017   Procedure: ESOPHAGEAL DILATION;  Surgeon: Golda Claudis PENNER, MD;  Location: [facility];  Service: Endoscopy;  Laterality: N/A;   ESOPHAGOGASTRODUODENOSCOPY (EGD) WITH PROPOFOL  N/A 08/23/2017   Procedure: ESOPHAGOGASTRODUODENOSCOPY (EGD) WITH PROPOFOL ;  Surgeon: Golda Claudis PENNER, MD;  Location: [facility];  Service: Endoscopy;  Laterality: N/A;   HAND SURGERY Bilateral    Right thumb and Left thumb   NOSE SURGERY     scrotal cyst     TURBINATE REDUCTION Bilateral 09/18/2019   Procedure: TURBINATE REDUCTION;  Surgeon: Karis Clunes, MD;  Location: [facility];  Service: ENT;  Laterality: Bilateral;       Home Medications     Prior to Admission medications   Medication Sig Start Date End Date Taking? Authorizing Provider  atorvastatin  (LIPITOR) 20 MG tablet Take 1 tablet (20 mg total) by mouth daily. 10/21/23   Cook, Jayce G, DO  diazepam  (VALIUM ) 5 MG tablet TAKE 1 TABLET BY MOUTH EVERY 12 HOURS AS NEEDED FOR ANXIETY OR SLEEP 05/04/24   Hoskins, Carolyn C, NP  fluticasone  (FLONASE ) 50 MCG/ACT nasal spray Place 2 sprays into both nostrils daily. 02/12/24   Cook, Jayce G, DO  levothyroxine  (SYNTHROID ) 75 MCG tablet Take 1 tablet (75 mcg total) by mouth daily. 02/12/24   Cook, Jayce G, DO  pantoprazole  (PROTONIX ) 40 MG tablet Take 1 tablet (40 mg total) by mouth daily. 02/12/24   Cook, Jayce G, DO    Family History Family History  Problem Relation Age of Onset   Hypertension Father    Congestive Heart Failure Father    Dementia Mother     Social History Social History   Tobacco Use   Smoking status: Never   Smokeless tobacco: Never  Substance Use Topics   Alcohol use: No   Drug use: No     Allergies   Patient has no known allergies.   Review of Systems Review of Systems  See HPI Physical Exam Triage Vital Signs ED Triage Vitals [05/30/24 1216]  Encounter Vitals Group     BP  103/69     Girls Systolic BP Percentile      Girls Diastolic BP Percentile      Boys Systolic BP Percentile      Boys Diastolic BP Percentile      Pulse Rate 65     Resp 18     Temp 97.6 F (36.4 C)     Temp Source Oral     SpO2 94 %     Weight      Height      Head Circumference      Peak Flow      Pain Score 7     Pain Loc      Pain Education      Exclude from Growth Chart    No data found.  Updated Vital Signs BP 103/69 (BP Location: Right Arm)   Pulse 65   Temp 97.6 F (36.4 C) (Oral)   Resp 18   SpO2 94%   Visual Acuity Right Eye Distance:   Left Eye Distance:   Bilateral Distance:    Right Eye Near:   Left Eye Near:    Bilateral Near:     Physical Exam Constitutional:       Appearance: Normal appearance.  Pulmonary:     Effort: Pulmonary effort is normal.  Musculoskeletal:        General: Normal range of motion.  Skin:    Findings: Laceration present.     Comments: Right middle finger laceration approximately 1/2 inch to tip of finger.  Right 4th finger approx 1/2 inch laceration to tip/pad of the finger.  Normal movement and sensation of the fingers.   Neurological:     Mental Status: He is alert.  Psychiatric:        Mood and Affect: Mood normal.      UC Treatments / Results  Labs (all labs ordered are listed, but only abnormal results are displayed) Labs Reviewed - No data to display  EKG   Radiology No results found.  Procedures Laceration Repair  Date/Time: 05/30/2024 3:48 PM  Performed by: Adah Wilbert LABOR, FNP Authorized by: Adah Wilbert LABOR, FNP   Consent:    Consent obtained:  Verbal   Consent given by:  Patient   Risks discussed:  Infection, need for additional repair, pain, poor cosmetic result and poor wound healing   Alternatives discussed:  No treatment and delayed treatment Universal protocol:    Procedure explained and questions answered to patient or proxy's satisfaction: yes     Relevant documents present and verified: yes     Test results available: yes     Imaging studies available: yes     Required blood products, implants, devices, and special equipment available: yes     Site/side marked: yes     Immediately prior to procedure, a time out was called: yes     Patient identity confirmed:  Verbally with patient Anesthesia:    Anesthesia method:  Local infiltration   Local anesthetic:  Lidocaine  2% w/o epi Laceration details:    Location:  Finger   Finger location:  R long finger Pre-procedure details:    Preparation:  Patient was prepped and draped in usual sterile fashion Exploration:    Hemostasis achieved with:  Direct pressure   Imaging outcome: foreign body not noted     Contaminated: no   Treatment:     Area cleansed with:  Chlorhexidine and saline   Amount of cleaning:  Standard   Irrigation solution:  Sterile saline   Irrigation method:  Pressure wash   Visualized foreign bodies/material removed: no     Debridement:  None   Undermining:  None Skin repair:    Repair method:  Sutures   Suture size:  4-0   Suture material:  Prolene   Suture technique:  Simple interrupted   Number of sutures:  6 Approximation:    Approximation:  Close Repair type:    Repair type:  Intermediate Post-procedure details:    Dressing:  Antibiotic ointment, non-adherent dressing and bulky dressing   Procedure completion:  Tolerated well, no immediate complications  (including critical care time)  Medications Ordered in UC Medications  Tdap (ADACEL) injection 0.5 mL (0.5 mLs Intramuscular Given 05/30/24 1331)    Initial Impression / Assessment and Plan / UC Course  I have reviewed the triage vital signs and the nursing notes.  Pertinent labs & imaging results that were available during my care of the patient were reviewed by me and considered in my medical decision making (see chart for details).     Lacerations of right 3rd and 4th fingers. We put 3 stitches in each finger today.  6 total sutures. He can come back in 7 to 10 days to have these removed. We updated your tetanus shot today. Keep these dressings on for at least 24 hours.  You can change tomorrow and gently clean with normal soap and water .  You can apply antibiotic ointment and redress.  Do this for the next few days.  You can then let to air if not going to be getting the hands dirty.  Keep protected and avoid getting infected. Please follow-up for any immediate concerns Final Clinical Impressions(s) / UC Diagnoses   Final diagnoses:  Laceration of finger of right hand without foreign body without damage to nail, unspecified finger, initial encounter     Discharge Instructions      We put 3 stitches in each finger today.  He can  come back in 7 to 10 days to have these removed. We updated your tetanus shot today. Keep these dressings on for at least 24 hours.  You can change tomorrow and gently clean with normal soap and water .  You can apply antibiotic ointment and redress.  Do this for the next few days.  You can then let to air if not going to be getting the hands dirty.  Keep protected and avoid getting infected. Please follow-up for any immediate concerns    ED Prescriptions   None    PDMP not reviewed this encounter.   Adah Wilbert LABOR, FNP 05/30/24 1550

## 2024-06-06 ENCOUNTER — Ambulatory Visit: Admission: EM | Admit: 2024-06-06 | Discharge: 2024-06-06 | Disposition: A | Discharge status: Home / Self Care

## 2024-06-06 NOTE — ED Triage Notes (Signed)
 Pt here for suture removal to right 2nd and 3rd digit.  Sutures intact no redness or swelling.
